# Patient Record
Sex: Male | Born: 1945 | Race: White | Marital: Married | Smoking: Former smoker
Health system: Southern US, Community
[De-identification: ages and names within clinical notes are randomized; demographics above are authoritative.]

## PROBLEM LIST (undated history)

## (undated) DIAGNOSIS — I509 Heart failure, unspecified: Secondary | ICD-10-CM

## (undated) DIAGNOSIS — T18128A Food in esophagus causing other injury, initial encounter: Secondary | ICD-10-CM

## (undated) DIAGNOSIS — I4891 Unspecified atrial fibrillation: Secondary | ICD-10-CM

## (undated) DIAGNOSIS — N189 Chronic kidney disease, unspecified: Secondary | ICD-10-CM

## (undated) DIAGNOSIS — M069 Rheumatoid arthritis, unspecified: Secondary | ICD-10-CM

## (undated) DIAGNOSIS — K220 Achalasia of cardia: Secondary | ICD-10-CM

## (undated) HISTORY — PX: ESOPHAGOGASTRODUODENOSCOPY: SHX1529

---

## 1976-05-14 HISTORY — PX: ANKLE FRACTURE SURGERY: SHX122

## 2010-08-07 ENCOUNTER — Other Ambulatory Visit: Payer: Self-pay | Admitting: *Deleted

## 2010-08-07 DIAGNOSIS — R935 Abnormal findings on diagnostic imaging of other abdominal regions, including retroperitoneum: Secondary | ICD-10-CM

## 2010-08-14 ENCOUNTER — Ambulatory Visit
Admission: RE | Admit: 2010-08-14 | Discharge: 2010-08-14 | Disposition: A | Discharge status: Home / Self Care | Payer: Non-veteran care | Source: Ambulatory Visit | Attending: *Deleted | Admitting: *Deleted

## 2010-08-14 DIAGNOSIS — R935 Abnormal findings on diagnostic imaging of other abdominal regions, including retroperitoneum: Secondary | ICD-10-CM

## 2010-08-14 MED ORDER — GADOBENATE DIMEGLUMINE 529 MG/ML IV SOLN
10.0000 mL | Freq: Once | INTRAVENOUS | Status: AC | PRN
  Administered 2010-08-14: 10 mL via INTRAVENOUS

## 2013-11-25 ENCOUNTER — Encounter (HOSPITAL_BASED_OUTPATIENT_CLINIC_OR_DEPARTMENT_OTHER): Payer: Medicare HMO | Attending: General Surgery

## 2013-11-25 DIAGNOSIS — Z79899 Other long term (current) drug therapy: Secondary | ICD-10-CM | POA: Insufficient documentation

## 2013-11-25 DIAGNOSIS — M069 Rheumatoid arthritis, unspecified: Secondary | ICD-10-CM | POA: Insufficient documentation

## 2013-11-25 DIAGNOSIS — L98499 Non-pressure chronic ulcer of skin of other sites with unspecified severity: Secondary | ICD-10-CM | POA: Diagnosis present

## 2013-11-25 DIAGNOSIS — Z7982 Long term (current) use of aspirin: Secondary | ICD-10-CM | POA: Insufficient documentation

## 2013-11-26 NOTE — Progress Notes (Signed)
[facility]  [name], [name] NO.:  [id number]  MEDICAL RECORD NO.:  [id number]      DATE OF BIRTH:  1945/10/02  PHYSICIAN:  Ardath Sax, M.D.           VISIT DATE:                                  OFFICE VISIT   Mr. Earl Barber is a 68 year old, morbidly obese man, who weighs well over 300 pounds, who comes to [place] because of a several months standing of an ulcer about 4-5 mm in size on his right buttocks.  This man has rheumatoid arthritis and gets around very poorly and spends a lot of time in a wheelchair, and I feel like he had like seborrheic dermatitis or some sort of a infection in pore or like a sebaceous cyst, because he has a small ulcer and it does not go very deep at all.  It just appears to involve the epidermis and dermis.  This man has severe rheumatoid arthritis, and he is on many medicines including allopurinol, alprazolam, aspirin, hydrocodone, cyanocobalamin, folic acid, Lasix, lisinopril, methotrexate, vitamins, ranitidine, and venlafaxine.  I am going to treat this with collagen and cover with DuoDERM and see how it does.  So, his diagnosis is small ulcer right buttocks probably has do with pressure or inflammation of his oil glands on the buttocks other diagnoses are hypertension, morbid obesity, and rheumatoid arthritis.     Ardath Sax, M.D.     PP/MEDQ  D:  11/25/2013  T:  11/26/2013  Job:  [id number]

## 2013-12-02 DIAGNOSIS — M069 Rheumatoid arthritis, unspecified: Secondary | ICD-10-CM | POA: Diagnosis not present

## 2013-12-02 DIAGNOSIS — L98499 Non-pressure chronic ulcer of skin of other sites with unspecified severity: Secondary | ICD-10-CM | POA: Diagnosis not present

## 2013-12-02 DIAGNOSIS — Z79899 Other long term (current) drug therapy: Secondary | ICD-10-CM | POA: Diagnosis not present

## 2015-02-15 DIAGNOSIS — M1711 Unilateral primary osteoarthritis, right knee: Secondary | ICD-10-CM | POA: Diagnosis not present

## 2015-02-15 DIAGNOSIS — R2681 Unsteadiness on feet: Secondary | ICD-10-CM | POA: Diagnosis not present

## 2015-02-15 DIAGNOSIS — M170 Bilateral primary osteoarthritis of knee: Secondary | ICD-10-CM | POA: Diagnosis not present

## 2015-02-15 DIAGNOSIS — M25562 Pain in left knee: Secondary | ICD-10-CM | POA: Diagnosis not present

## 2015-02-15 DIAGNOSIS — M25561 Pain in right knee: Secondary | ICD-10-CM | POA: Diagnosis not present

## 2015-02-18 DIAGNOSIS — M170 Bilateral primary osteoarthritis of knee: Secondary | ICD-10-CM | POA: Diagnosis not present

## 2015-02-18 DIAGNOSIS — M1712 Unilateral primary osteoarthritis, left knee: Secondary | ICD-10-CM | POA: Diagnosis not present

## 2015-02-18 DIAGNOSIS — R2681 Unsteadiness on feet: Secondary | ICD-10-CM | POA: Diagnosis not present

## 2015-02-18 DIAGNOSIS — M25561 Pain in right knee: Secondary | ICD-10-CM | POA: Diagnosis not present

## 2015-02-18 DIAGNOSIS — M25562 Pain in left knee: Secondary | ICD-10-CM | POA: Diagnosis not present

## 2015-02-28 DIAGNOSIS — M25561 Pain in right knee: Secondary | ICD-10-CM | POA: Diagnosis not present

## 2015-02-28 DIAGNOSIS — M1711 Unilateral primary osteoarthritis, right knee: Secondary | ICD-10-CM | POA: Diagnosis not present

## 2015-03-04 DIAGNOSIS — M1712 Unilateral primary osteoarthritis, left knee: Secondary | ICD-10-CM | POA: Diagnosis not present

## 2015-03-04 DIAGNOSIS — M25562 Pain in left knee: Secondary | ICD-10-CM | POA: Diagnosis not present

## 2015-06-15 DIAGNOSIS — M170 Bilateral primary osteoarthritis of knee: Secondary | ICD-10-CM | POA: Diagnosis not present

## 2015-06-15 DIAGNOSIS — M25562 Pain in left knee: Secondary | ICD-10-CM | POA: Diagnosis not present

## 2015-06-15 DIAGNOSIS — M25561 Pain in right knee: Secondary | ICD-10-CM | POA: Diagnosis not present

## 2015-06-15 DIAGNOSIS — R262 Difficulty in walking, not elsewhere classified: Secondary | ICD-10-CM | POA: Diagnosis not present

## 2016-02-01 DIAGNOSIS — R69 Illness, unspecified: Secondary | ICD-10-CM | POA: Diagnosis not present

## 2016-08-20 DIAGNOSIS — R1310 Dysphagia, unspecified: Secondary | ICD-10-CM | POA: Diagnosis not present

## 2016-08-20 DIAGNOSIS — Z0000 Encounter for general adult medical examination without abnormal findings: Secondary | ICD-10-CM | POA: Diagnosis not present

## 2016-08-20 DIAGNOSIS — Z79899 Other long term (current) drug therapy: Secondary | ICD-10-CM | POA: Diagnosis not present

## 2016-08-20 DIAGNOSIS — Z8546 Personal history of malignant neoplasm of prostate: Secondary | ICD-10-CM | POA: Diagnosis not present

## 2016-08-20 DIAGNOSIS — K219 Gastro-esophageal reflux disease without esophagitis: Secondary | ICD-10-CM | POA: Diagnosis not present

## 2016-08-20 DIAGNOSIS — R600 Localized edema: Secondary | ICD-10-CM | POA: Diagnosis not present

## 2016-08-20 DIAGNOSIS — I10 Essential (primary) hypertension: Secondary | ICD-10-CM | POA: Diagnosis not present

## 2016-08-20 DIAGNOSIS — M109 Gout, unspecified: Secondary | ICD-10-CM | POA: Diagnosis not present

## 2016-08-20 DIAGNOSIS — R69 Illness, unspecified: Secondary | ICD-10-CM | POA: Diagnosis not present

## 2016-08-20 DIAGNOSIS — K5900 Constipation, unspecified: Secondary | ICD-10-CM | POA: Diagnosis not present

## 2016-08-20 DIAGNOSIS — K08109 Complete loss of teeth, unspecified cause, unspecified class: Secondary | ICD-10-CM | POA: Diagnosis not present

## 2016-08-20 DIAGNOSIS — G4730 Sleep apnea, unspecified: Secondary | ICD-10-CM | POA: Diagnosis not present

## 2016-08-20 DIAGNOSIS — R110 Nausea: Secondary | ICD-10-CM | POA: Diagnosis not present

## 2016-08-20 DIAGNOSIS — R0600 Dyspnea, unspecified: Secondary | ICD-10-CM | POA: Diagnosis not present

## 2016-08-20 DIAGNOSIS — Z9989 Dependence on other enabling machines and devices: Secondary | ICD-10-CM | POA: Diagnosis not present

## 2017-01-03 HISTORY — PX: ESOPHAGOSCOPY W/ BOTOX INJECTION: SHX1533

## 2017-02-17 DIAGNOSIS — R69 Illness, unspecified: Secondary | ICD-10-CM | POA: Diagnosis not present

## 2017-03-20 DIAGNOSIS — Z4682 Encounter for fitting and adjustment of non-vascular catheter: Secondary | ICD-10-CM | POA: Diagnosis not present

## 2017-03-20 DIAGNOSIS — M069 Rheumatoid arthritis, unspecified: Secondary | ICD-10-CM | POA: Diagnosis not present

## 2017-03-20 DIAGNOSIS — R402441 Other coma, without documented Glasgow coma scale score, or with partial score reported, in the field [EMT or ambulance]: Secondary | ICD-10-CM | POA: Diagnosis not present

## 2017-03-20 DIAGNOSIS — Z7952 Long term (current) use of systemic steroids: Secondary | ICD-10-CM | POA: Diagnosis not present

## 2017-03-20 DIAGNOSIS — I130 Hypertensive heart and chronic kidney disease with heart failure and stage 1 through stage 4 chronic kidney disease, or unspecified chronic kidney disease: Secondary | ICD-10-CM | POA: Diagnosis present

## 2017-03-20 DIAGNOSIS — R6521 Severe sepsis with septic shock: Secondary | ICD-10-CM | POA: Diagnosis not present

## 2017-03-20 DIAGNOSIS — I5021 Acute systolic (congestive) heart failure: Secondary | ICD-10-CM | POA: Diagnosis not present

## 2017-03-20 DIAGNOSIS — Z888 Allergy status to other drugs, medicaments and biological substances status: Secondary | ICD-10-CM | POA: Diagnosis not present

## 2017-03-20 DIAGNOSIS — Y95 Nosocomial condition: Secondary | ICD-10-CM | POA: Diagnosis not present

## 2017-03-20 DIAGNOSIS — R531 Weakness: Secondary | ICD-10-CM | POA: Diagnosis not present

## 2017-03-20 DIAGNOSIS — R262 Difficulty in walking, not elsewhere classified: Secondary | ICD-10-CM | POA: Diagnosis not present

## 2017-03-20 DIAGNOSIS — I6340 Cerebral infarction due to embolism of unspecified cerebral artery: Secondary | ICD-10-CM | POA: Diagnosis not present

## 2017-03-20 DIAGNOSIS — J9601 Acute respiratory failure with hypoxia: Secondary | ICD-10-CM | POA: Diagnosis not present

## 2017-03-20 DIAGNOSIS — Z7189 Other specified counseling: Secondary | ICD-10-CM | POA: Diagnosis not present

## 2017-03-20 DIAGNOSIS — Z515 Encounter for palliative care: Secondary | ICD-10-CM | POA: Diagnosis not present

## 2017-03-20 DIAGNOSIS — Z882 Allergy status to sulfonamides status: Secondary | ICD-10-CM | POA: Diagnosis not present

## 2017-03-20 DIAGNOSIS — F22 Delusional disorders: Secondary | ICD-10-CM | POA: Diagnosis present

## 2017-03-20 DIAGNOSIS — N170 Acute kidney failure with tubular necrosis: Secondary | ICD-10-CM | POA: Diagnosis not present

## 2017-03-20 DIAGNOSIS — Z452 Encounter for adjustment and management of vascular access device: Secondary | ICD-10-CM | POA: Diagnosis not present

## 2017-03-20 DIAGNOSIS — Z66 Do not resuscitate: Secondary | ICD-10-CM | POA: Diagnosis not present

## 2017-03-20 DIAGNOSIS — R4182 Altered mental status, unspecified: Secondary | ICD-10-CM | POA: Diagnosis not present

## 2017-03-20 DIAGNOSIS — T85898A Other specified complication of other internal prosthetic devices, implants and grafts, initial encounter: Secondary | ICD-10-CM | POA: Diagnosis not present

## 2017-03-20 DIAGNOSIS — M6281 Muscle weakness (generalized): Secondary | ICD-10-CM | POA: Diagnosis not present

## 2017-03-20 DIAGNOSIS — M25061 Hemarthrosis, right knee: Secondary | ICD-10-CM | POA: Diagnosis not present

## 2017-03-20 DIAGNOSIS — J9690 Respiratory failure, unspecified, unspecified whether with hypoxia or hypercapnia: Secondary | ICD-10-CM | POA: Diagnosis not present

## 2017-03-20 DIAGNOSIS — N183 Chronic kidney disease, stage 3 (moderate): Secondary | ICD-10-CM | POA: Diagnosis not present

## 2017-03-20 DIAGNOSIS — K220 Achalasia of cardia: Secondary | ICD-10-CM | POA: Diagnosis present

## 2017-03-20 DIAGNOSIS — I517 Cardiomegaly: Secondary | ICD-10-CM | POA: Diagnosis not present

## 2017-03-20 DIAGNOSIS — R1319 Other dysphagia: Secondary | ICD-10-CM | POA: Diagnosis not present

## 2017-03-20 DIAGNOSIS — R410 Disorientation, unspecified: Secondary | ICD-10-CM | POA: Diagnosis present

## 2017-03-20 DIAGNOSIS — A419 Sepsis, unspecified organism: Secondary | ICD-10-CM | POA: Diagnosis not present

## 2017-03-20 DIAGNOSIS — K8080 Other cholelithiasis without obstruction: Secondary | ICD-10-CM | POA: Diagnosis not present

## 2017-03-20 DIAGNOSIS — D631 Anemia in chronic kidney disease: Secondary | ICD-10-CM | POA: Diagnosis present

## 2017-03-20 DIAGNOSIS — K210 Gastro-esophageal reflux disease with esophagitis: Secondary | ICD-10-CM | POA: Diagnosis not present

## 2017-03-20 DIAGNOSIS — R918 Other nonspecific abnormal finding of lung field: Secondary | ICD-10-CM | POA: Diagnosis not present

## 2017-03-20 DIAGNOSIS — Z9109 Other allergy status, other than to drugs and biological substances: Secondary | ICD-10-CM | POA: Diagnosis not present

## 2017-03-20 DIAGNOSIS — D689 Coagulation defect, unspecified: Secondary | ICD-10-CM | POA: Diagnosis present

## 2017-03-20 DIAGNOSIS — E2740 Unspecified adrenocortical insufficiency: Secondary | ICD-10-CM | POA: Diagnosis present

## 2017-03-20 DIAGNOSIS — I639 Cerebral infarction, unspecified: Secondary | ICD-10-CM | POA: Diagnosis not present

## 2017-03-20 DIAGNOSIS — G894 Chronic pain syndrome: Secondary | ICD-10-CM | POA: Diagnosis not present

## 2017-03-20 DIAGNOSIS — E870 Hyperosmolality and hypernatremia: Secondary | ICD-10-CM | POA: Diagnosis present

## 2017-03-20 DIAGNOSIS — R2681 Unsteadiness on feet: Secondary | ICD-10-CM | POA: Diagnosis not present

## 2017-03-20 DIAGNOSIS — I4891 Unspecified atrial fibrillation: Secondary | ICD-10-CM | POA: Diagnosis not present

## 2017-03-20 DIAGNOSIS — K5780 Diverticulitis of intestine, part unspecified, with perforation and abscess without bleeding: Secondary | ICD-10-CM | POA: Diagnosis not present

## 2017-03-20 DIAGNOSIS — J189 Pneumonia, unspecified organism: Secondary | ICD-10-CM | POA: Diagnosis not present

## 2017-03-20 DIAGNOSIS — I214 Non-ST elevation (NSTEMI) myocardial infarction: Secondary | ICD-10-CM | POA: Diagnosis not present

## 2017-03-20 DIAGNOSIS — L89109 Pressure ulcer of unspecified part of back, unspecified stage: Secondary | ICD-10-CM | POA: Diagnosis not present

## 2017-03-20 DIAGNOSIS — D6489 Other specified anemias: Secondary | ICD-10-CM | POA: Diagnosis present

## 2017-03-20 DIAGNOSIS — G9341 Metabolic encephalopathy: Secondary | ICD-10-CM | POA: Diagnosis not present

## 2017-03-20 DIAGNOSIS — G4733 Obstructive sleep apnea (adult) (pediatric): Secondary | ICD-10-CM | POA: Diagnosis not present

## 2017-03-22 DIAGNOSIS — K210 Gastro-esophageal reflux disease with esophagitis: Secondary | ICD-10-CM | POA: Diagnosis not present

## 2017-03-22 DIAGNOSIS — G4733 Obstructive sleep apnea (adult) (pediatric): Secondary | ICD-10-CM | POA: Diagnosis not present

## 2017-03-22 DIAGNOSIS — M25061 Hemarthrosis, right knee: Secondary | ICD-10-CM | POA: Diagnosis not present

## 2017-03-22 DIAGNOSIS — G894 Chronic pain syndrome: Secondary | ICD-10-CM | POA: Diagnosis not present

## 2017-03-22 DIAGNOSIS — M069 Rheumatoid arthritis, unspecified: Secondary | ICD-10-CM | POA: Diagnosis not present

## 2017-03-24 ENCOUNTER — Other Ambulatory Visit: Payer: Self-pay

## 2017-03-24 ENCOUNTER — Inpatient Hospital Stay (HOSPITAL_COMMUNITY)
Admission: EM | Admit: 2017-03-24 | Discharge: 2017-04-13 | DRG: 871 | Disposition: E | Payer: Medicare HMO | Attending: Pulmonary Disease | Admitting: Pulmonary Disease

## 2017-03-24 ENCOUNTER — Encounter (HOSPITAL_COMMUNITY): Payer: Self-pay

## 2017-03-24 ENCOUNTER — Emergency Department (HOSPITAL_COMMUNITY): Payer: Medicare HMO

## 2017-03-24 DIAGNOSIS — Z515 Encounter for palliative care: Secondary | ICD-10-CM

## 2017-03-24 DIAGNOSIS — F22 Delusional disorders: Secondary | ICD-10-CM | POA: Diagnosis present

## 2017-03-24 DIAGNOSIS — I6340 Cerebral infarction due to embolism of unspecified cerebral artery: Secondary | ICD-10-CM | POA: Diagnosis not present

## 2017-03-24 DIAGNOSIS — N183 Chronic kidney disease, stage 3 (moderate): Secondary | ICD-10-CM | POA: Diagnosis present

## 2017-03-24 DIAGNOSIS — M069 Rheumatoid arthritis, unspecified: Secondary | ICD-10-CM | POA: Diagnosis present

## 2017-03-24 DIAGNOSIS — Z7189 Other specified counseling: Secondary | ICD-10-CM | POA: Diagnosis not present

## 2017-03-24 DIAGNOSIS — R6521 Severe sepsis with septic shock: Secondary | ICD-10-CM | POA: Diagnosis not present

## 2017-03-24 DIAGNOSIS — Z781 Physical restraint status: Secondary | ICD-10-CM

## 2017-03-24 DIAGNOSIS — J189 Pneumonia, unspecified organism: Secondary | ICD-10-CM | POA: Diagnosis not present

## 2017-03-24 DIAGNOSIS — D689 Coagulation defect, unspecified: Secondary | ICD-10-CM | POA: Diagnosis present

## 2017-03-24 DIAGNOSIS — A419 Sepsis, unspecified organism: Principal | ICD-10-CM | POA: Diagnosis present

## 2017-03-24 DIAGNOSIS — R918 Other nonspecific abnormal finding of lung field: Secondary | ICD-10-CM | POA: Diagnosis not present

## 2017-03-24 DIAGNOSIS — Y95 Nosocomial condition: Secondary | ICD-10-CM | POA: Diagnosis not present

## 2017-03-24 DIAGNOSIS — I517 Cardiomegaly: Secondary | ICD-10-CM | POA: Diagnosis not present

## 2017-03-24 DIAGNOSIS — Z452 Encounter for adjustment and management of vascular access device: Secondary | ICD-10-CM

## 2017-03-24 DIAGNOSIS — J9601 Acute respiratory failure with hypoxia: Secondary | ICD-10-CM | POA: Diagnosis not present

## 2017-03-24 DIAGNOSIS — R410 Disorientation, unspecified: Secondary | ICD-10-CM | POA: Diagnosis present

## 2017-03-24 DIAGNOSIS — E2740 Unspecified adrenocortical insufficiency: Secondary | ICD-10-CM | POA: Diagnosis present

## 2017-03-24 DIAGNOSIS — Z888 Allergy status to other drugs, medicaments and biological substances status: Secondary | ICD-10-CM | POA: Diagnosis not present

## 2017-03-24 DIAGNOSIS — J9690 Respiratory failure, unspecified, unspecified whether with hypoxia or hypercapnia: Secondary | ICD-10-CM

## 2017-03-24 DIAGNOSIS — Z87891 Personal history of nicotine dependence: Secondary | ICD-10-CM

## 2017-03-24 DIAGNOSIS — Z7901 Long term (current) use of anticoagulants: Secondary | ICD-10-CM

## 2017-03-24 DIAGNOSIS — R402441 Other coma, without documented Glasgow coma scale score, or with partial score reported, in the field [EMT or ambulance]: Secondary | ICD-10-CM | POA: Diagnosis not present

## 2017-03-24 DIAGNOSIS — Z7952 Long term (current) use of systemic steroids: Secondary | ICD-10-CM

## 2017-03-24 DIAGNOSIS — E870 Hyperosmolality and hypernatremia: Secondary | ICD-10-CM | POA: Diagnosis present

## 2017-03-24 DIAGNOSIS — E875 Hyperkalemia: Secondary | ICD-10-CM | POA: Diagnosis present

## 2017-03-24 DIAGNOSIS — K220 Achalasia of cardia: Secondary | ICD-10-CM | POA: Diagnosis present

## 2017-03-24 DIAGNOSIS — G9341 Metabolic encephalopathy: Secondary | ICD-10-CM | POA: Diagnosis present

## 2017-03-24 DIAGNOSIS — Z4682 Encounter for fitting and adjustment of non-vascular catheter: Secondary | ICD-10-CM | POA: Diagnosis not present

## 2017-03-24 DIAGNOSIS — D6489 Other specified anemias: Secondary | ICD-10-CM | POA: Diagnosis present

## 2017-03-24 DIAGNOSIS — Z882 Allergy status to sulfonamides status: Secondary | ICD-10-CM

## 2017-03-24 DIAGNOSIS — R739 Hyperglycemia, unspecified: Secondary | ICD-10-CM | POA: Diagnosis present

## 2017-03-24 DIAGNOSIS — L89152 Pressure ulcer of sacral region, stage 2: Secondary | ICD-10-CM | POA: Diagnosis present

## 2017-03-24 DIAGNOSIS — I130 Hypertensive heart and chronic kidney disease with heart failure and stage 1 through stage 4 chronic kidney disease, or unspecified chronic kidney disease: Secondary | ICD-10-CM | POA: Diagnosis not present

## 2017-03-24 DIAGNOSIS — L8990 Pressure ulcer of unspecified site, unspecified stage: Secondary | ICD-10-CM

## 2017-03-24 DIAGNOSIS — Z66 Do not resuscitate: Secondary | ICD-10-CM | POA: Diagnosis not present

## 2017-03-24 DIAGNOSIS — Z4659 Encounter for fitting and adjustment of other gastrointestinal appliance and device: Secondary | ICD-10-CM

## 2017-03-24 DIAGNOSIS — I214 Non-ST elevation (NSTEMI) myocardial infarction: Secondary | ICD-10-CM | POA: Diagnosis not present

## 2017-03-24 DIAGNOSIS — I639 Cerebral infarction, unspecified: Secondary | ICD-10-CM | POA: Diagnosis not present

## 2017-03-24 DIAGNOSIS — N170 Acute kidney failure with tubular necrosis: Secondary | ICD-10-CM | POA: Diagnosis not present

## 2017-03-24 DIAGNOSIS — I482 Chronic atrial fibrillation: Secondary | ICD-10-CM | POA: Diagnosis present

## 2017-03-24 DIAGNOSIS — I4891 Unspecified atrial fibrillation: Secondary | ICD-10-CM | POA: Diagnosis not present

## 2017-03-24 DIAGNOSIS — Z79899 Other long term (current) drug therapy: Secondary | ICD-10-CM

## 2017-03-24 DIAGNOSIS — Z9109 Other allergy status, other than to drugs and biological substances: Secondary | ICD-10-CM

## 2017-03-24 DIAGNOSIS — T85898A Other specified complication of other internal prosthetic devices, implants and grafts, initial encounter: Secondary | ICD-10-CM | POA: Diagnosis not present

## 2017-03-24 DIAGNOSIS — Z7982 Long term (current) use of aspirin: Secondary | ICD-10-CM

## 2017-03-24 DIAGNOSIS — I5021 Acute systolic (congestive) heart failure: Secondary | ICD-10-CM | POA: Diagnosis not present

## 2017-03-24 DIAGNOSIS — E46 Unspecified protein-calorie malnutrition: Secondary | ICD-10-CM

## 2017-03-24 DIAGNOSIS — D631 Anemia in chronic kidney disease: Secondary | ICD-10-CM | POA: Diagnosis present

## 2017-03-24 DIAGNOSIS — R29711 NIHSS score 11: Secondary | ICD-10-CM | POA: Diagnosis present

## 2017-03-24 DIAGNOSIS — R4182 Altered mental status, unspecified: Secondary | ICD-10-CM | POA: Diagnosis not present

## 2017-03-24 HISTORY — DX: Unspecified atrial fibrillation: I48.91

## 2017-03-24 HISTORY — DX: Achalasia of cardia: K22.0

## 2017-03-24 HISTORY — DX: Rheumatoid arthritis, unspecified: M06.9

## 2017-03-24 HISTORY — DX: Chronic kidney disease, unspecified: N18.9

## 2017-03-24 HISTORY — DX: Heart failure, unspecified: I50.9

## 2017-03-24 HISTORY — DX: Food in esophagus causing other injury, initial encounter: T18.128A

## 2017-03-24 LAB — URINALYSIS, ROUTINE W REFLEX MICROSCOPIC
BILIRUBIN URINE: NEGATIVE
Glucose, UA: NEGATIVE mg/dL
Hgb urine dipstick: NEGATIVE
KETONES UR: NEGATIVE mg/dL
Leukocytes, UA: NEGATIVE
NITRITE: NEGATIVE
PROTEIN: NEGATIVE mg/dL
SPECIFIC GRAVITY, URINE: 1.014 (ref 1.005–1.030)
pH: 5 (ref 5.0–8.0)

## 2017-03-24 LAB — I-STAT ARTERIAL BLOOD GAS, ED
Acid-base deficit: 1 mmol/L (ref 0.0–2.0)
Bicarbonate: 26.1 mmol/L (ref 20.0–28.0)
O2 SAT: 99 %
PCO2 ART: 53.8 mmHg — AB (ref 32.0–48.0)
TCO2: 28 mmol/L (ref 22–32)
pH, Arterial: 7.296 — ABNORMAL LOW (ref 7.350–7.450)
pO2, Arterial: 146 mmHg — ABNORMAL HIGH (ref 83.0–108.0)

## 2017-03-24 LAB — CBC WITH DIFFERENTIAL/PLATELET
BASOS PCT: 0 %
Basophils Absolute: 0 10*3/uL (ref 0.0–0.1)
EOS ABS: 0.2 10*3/uL (ref 0.0–0.7)
Eosinophils Relative: 1 %
HCT: 32 % — ABNORMAL LOW (ref 39.0–52.0)
HEMOGLOBIN: 10.3 g/dL — AB (ref 13.0–17.0)
LYMPHS ABS: 0.8 10*3/uL (ref 0.7–4.0)
LYMPHS PCT: 5 %
MCH: 33.7 pg (ref 26.0–34.0)
MCHC: 32.2 g/dL (ref 30.0–36.0)
MCV: 104.6 fL — ABNORMAL HIGH (ref 78.0–100.0)
MONOS PCT: 10 %
Monocytes Absolute: 1.7 10*3/uL — ABNORMAL HIGH (ref 0.1–1.0)
NEUTROS ABS: 13.8 10*3/uL — AB (ref 1.7–7.7)
NEUTROS PCT: 84 %
PLATELETS: 362 10*3/uL (ref 150–400)
RBC: 3.06 MIL/uL — ABNORMAL LOW (ref 4.22–5.81)
RDW: 14.9 % (ref 11.5–15.5)
WBC: 16.5 10*3/uL — ABNORMAL HIGH (ref 4.0–10.5)

## 2017-03-24 LAB — I-STAT VENOUS BLOOD GAS, ED
Acid-base deficit: 2 mmol/L (ref 0.0–2.0)
BICARBONATE: 25.4 mmol/L (ref 20.0–28.0)
O2 Saturation: 62 %
TCO2: 27 mmol/L (ref 22–32)
pCO2, Ven: 52.1 mmHg (ref 44.0–60.0)
pH, Ven: 7.298 (ref 7.250–7.430)
pO2, Ven: 37 mmHg (ref 32.0–45.0)

## 2017-03-24 LAB — I-STAT TROPONIN, ED: TROPONIN I, POC: 0.05 ng/mL (ref 0.00–0.08)

## 2017-03-24 LAB — COMPREHENSIVE METABOLIC PANEL
ALBUMIN: 2.8 g/dL — AB (ref 3.5–5.0)
ALT: 29 U/L (ref 17–63)
ANION GAP: 11 (ref 5–15)
AST: 29 U/L (ref 15–41)
Alkaline Phosphatase: 138 U/L — ABNORMAL HIGH (ref 38–126)
BUN: 76 mg/dL — ABNORMAL HIGH (ref 6–20)
CALCIUM: 9.2 mg/dL (ref 8.9–10.3)
CHLORIDE: 97 mmol/L — AB (ref 101–111)
CO2: 24 mmol/L (ref 22–32)
Creatinine, Ser: 4.24 mg/dL — ABNORMAL HIGH (ref 0.61–1.24)
GFR calc non Af Amer: 13 mL/min — ABNORMAL LOW (ref >60)
GFR, EST AFRICAN AMERICAN: 15 mL/min — AB (ref >60)
GLUCOSE: 128 mg/dL — AB (ref 65–99)
POTASSIUM: 5.5 mmol/L — AB (ref 3.5–5.1)
SODIUM: 132 mmol/L — AB (ref 135–145)
Total Bilirubin: 1.2 mg/dL (ref 0.3–1.2)
Total Protein: 6.5 g/dL (ref 6.5–8.1)

## 2017-03-24 LAB — BASIC METABOLIC PANEL
Anion gap: 12 (ref 5–15)
BUN: 71 mg/dL — AB (ref 6–20)
CALCIUM: 8.7 mg/dL — AB (ref 8.9–10.3)
CO2: 22 mmol/L (ref 22–32)
CREATININE: 3.89 mg/dL — AB (ref 0.61–1.24)
Chloride: 100 mmol/L — ABNORMAL LOW (ref 101–111)
GFR calc non Af Amer: 14 mL/min — ABNORMAL LOW (ref >60)
GFR, EST AFRICAN AMERICAN: 16 mL/min — AB (ref >60)
GLUCOSE: 185 mg/dL — AB (ref 65–99)
Potassium: 5 mmol/L (ref 3.5–5.1)
Sodium: 134 mmol/L — ABNORMAL LOW (ref 135–145)

## 2017-03-24 LAB — GLUCOSE, CAPILLARY: Glucose-Capillary: 171 mg/dL — ABNORMAL HIGH (ref 65–99)

## 2017-03-24 LAB — PROCALCITONIN: Procalcitonin: 0.66 ng/mL

## 2017-03-24 LAB — TYPE AND SCREEN
ABO/RH(D): O POS
ANTIBODY SCREEN: NEGATIVE

## 2017-03-24 LAB — CBG MONITORING, ED: GLUCOSE-CAPILLARY: 110 mg/dL — AB (ref 65–99)

## 2017-03-24 LAB — STREP PNEUMONIAE URINARY ANTIGEN: STREP PNEUMO URINARY ANTIGEN: NEGATIVE

## 2017-03-24 LAB — PROTIME-INR
INR: 2.66
PROTHROMBIN TIME: 28.1 s — AB (ref 11.4–15.2)

## 2017-03-24 LAB — I-STAT CG4 LACTIC ACID, ED
LACTIC ACID, VENOUS: 1.01 mmol/L (ref 0.5–1.9)
Lactic Acid, Venous: 0.76 mmol/L (ref 0.5–1.9)

## 2017-03-24 LAB — BRAIN NATRIURETIC PEPTIDE: B NATRIURETIC PEPTIDE 5: 78.1 pg/mL (ref 0.0–100.0)

## 2017-03-24 LAB — MAGNESIUM: Magnesium: 2.2 mg/dL (ref 1.7–2.4)

## 2017-03-24 LAB — TROPONIN I: Troponin I: <0.03 ng/mL (ref <0.03)

## 2017-03-24 LAB — PHOSPHORUS: Phosphorus: 5 mg/dL — ABNORMAL HIGH (ref 2.5–4.6)

## 2017-03-24 LAB — LACTIC ACID, PLASMA: Lactic Acid, Venous: 1.3 mmol/L (ref 0.5–1.9)

## 2017-03-24 LAB — APTT: aPTT: 51 seconds — ABNORMAL HIGH (ref 24–36)

## 2017-03-24 MED ORDER — SODIUM CHLORIDE 0.9 % IV BOLUS (SEPSIS)
500.0000 mL | Freq: Once | INTRAVENOUS | Status: AC
  Administered 2017-03-24: 500 mL via INTRAVENOUS

## 2017-03-24 MED ORDER — PANTOPRAZOLE SODIUM 40 MG IV SOLR
40.0000 mg | Freq: Two times a day (BID) | INTRAVENOUS | Status: DC
  Administered 2017-03-24 – 2017-03-28 (×8): 40 mg via INTRAVENOUS
  Filled 2017-03-24 (×8): qty 40

## 2017-03-24 MED ORDER — ORAL CARE MOUTH RINSE
15.0000 mL | Freq: Four times a day (QID) | OROMUCOSAL | Status: DC
  Administered 2017-03-24 – 2017-03-28 (×14): 15 mL via OROMUCOSAL

## 2017-03-24 MED ORDER — SODIUM CHLORIDE 0.9 % IV BOLUS (SEPSIS)
2000.0000 mL | Freq: Once | INTRAVENOUS | Status: AC
  Administered 2017-03-24: 2000 mL via INTRAVENOUS

## 2017-03-24 MED ORDER — PIPERACILLIN-TAZOBACTAM IN DEX 2-0.25 GM/50ML IV SOLN
2.2500 g | Freq: Four times a day (QID) | INTRAVENOUS | Status: DC
  Administered 2017-03-24 – 2017-03-25 (×3): 2.25 g via INTRAVENOUS
  Filled 2017-03-24 (×4): qty 50

## 2017-03-24 MED ORDER — SODIUM CHLORIDE 0.9 % IV BOLUS (SEPSIS): 500.0000 mL | Freq: Once | INTRAVENOUS | Status: DC

## 2017-03-24 MED ORDER — HEPARIN SODIUM (PORCINE) 5000 UNIT/ML IJ SOLN
5000.0000 [IU] | Freq: Three times a day (TID) | INTRAMUSCULAR | Status: DC
  Administered 2017-03-25 – 2017-03-28 (×10): 5000 [IU] via SUBCUTANEOUS
  Filled 2017-03-24 (×11): qty 1

## 2017-03-24 MED ORDER — VANCOMYCIN HCL IN DEXTROSE 1-5 GM/200ML-% IV SOLN
1000.0000 mg | Freq: Once | INTRAVENOUS | Status: AC
  Administered 2017-03-24: 1000 mg via INTRAVENOUS
  Filled 2017-03-24: qty 200

## 2017-03-24 MED ORDER — SODIUM CHLORIDE 0.9 % IV SOLN: 250.0000 mL | INTRAVENOUS | Status: DC | PRN

## 2017-03-24 MED ORDER — FAMOTIDINE IN NACL 20-0.9 MG/50ML-% IV SOLN
20.0000 mg | Freq: Two times a day (BID) | INTRAVENOUS | Status: DC
  Administered 2017-03-24 – 2017-03-25 (×2): 20 mg via INTRAVENOUS
  Filled 2017-03-24 (×2): qty 50

## 2017-03-24 MED ORDER — DEXTROSE 5 % IV SOLN
500.0000 mg | INTRAVENOUS | Status: DC
  Administered 2017-03-24: 500 mg via INTRAVENOUS
  Filled 2017-03-24: qty 500

## 2017-03-24 MED ORDER — NOREPINEPHRINE BITARTRATE 1 MG/ML IV SOLN
0.0000 ug/min | Freq: Once | INTRAVENOUS | Status: DC
  Filled 2017-03-24: qty 4

## 2017-03-24 MED ORDER — FENTANYL CITRATE (PF) 100 MCG/2ML IJ SOLN
25.0000 ug | INTRAMUSCULAR | Status: DC | PRN
  Administered 2017-03-24 – 2017-03-27 (×9): 50 ug via INTRAVENOUS
  Filled 2017-03-24 (×10): qty 2

## 2017-03-24 MED ORDER — PHENYLEPHRINE HCL 10 MG/ML IJ SOLN
INTRAMUSCULAR | Status: AC | PRN
  Administered 2017-03-24: 50 ug

## 2017-03-24 MED ORDER — ETOMIDATE 2 MG/ML IV SOLN
INTRAVENOUS | Status: AC | PRN
  Administered 2017-03-24: 20 mg via INTRAVENOUS

## 2017-03-24 MED ORDER — ROCURONIUM BROMIDE 50 MG/5ML IV SOLN
INTRAVENOUS | Status: AC | PRN
  Administered 2017-03-24: 80 mg via INTRAVENOUS

## 2017-03-24 MED ORDER — VANCOMYCIN HCL IN DEXTROSE 1-5 GM/200ML-% IV SOLN
1000.0000 mg | INTRAVENOUS | Status: DC
  Filled 2017-03-24: qty 200

## 2017-03-24 MED ORDER — CHLORHEXIDINE GLUCONATE 0.12% ORAL RINSE (MEDLINE KIT)
15.0000 mL | Freq: Two times a day (BID) | OROMUCOSAL | Status: DC
Start: 2017-03-24 — End: 2017-03-28
  Administered 2017-03-24 – 2017-03-28 (×8): 15 mL via OROMUCOSAL

## 2017-03-24 MED ORDER — HYDROCORTISONE NA SUCCINATE PF 100 MG IJ SOLR
50.0000 mg | Freq: Four times a day (QID) | INTRAMUSCULAR | Status: DC
  Administered 2017-03-24 – 2017-03-29 (×19): 50 mg via INTRAVENOUS
  Filled 2017-03-24 (×5): qty 1
  Filled 2017-03-24: qty 2
  Filled 2017-03-24 (×3): qty 1
  Filled 2017-03-24: qty 2
  Filled 2017-03-24 (×4): qty 1
  Filled 2017-03-24: qty 2
  Filled 2017-03-24 (×5): qty 1

## 2017-03-24 MED ORDER — HYDROCORTISONE NA SUCCINATE PF 100 MG IJ SOLR
100.0000 mg | Freq: Once | INTRAMUSCULAR | Status: AC
  Administered 2017-03-24: 100 mg via INTRAVENOUS
  Filled 2017-03-24: qty 2

## 2017-03-24 MED ORDER — PIPERACILLIN-TAZOBACTAM 3.375 G IVPB 30 MIN
3.3750 g | Freq: Once | INTRAVENOUS | Status: AC
  Administered 2017-03-24: 3.375 g via INTRAVENOUS
  Filled 2017-03-24: qty 50

## 2017-03-24 MED ORDER — PHENYLEPHRINE 40 MCG/ML (10ML) SYRINGE FOR IV PUSH (FOR BLOOD PRESSURE SUPPORT)
PREFILLED_SYRINGE | INTRAVENOUS | Status: AC
  Filled 2017-03-24: qty 10

## 2017-03-24 MED ORDER — INSULIN ASPART 100 UNIT/ML ~~LOC~~ SOLN
0.0000 [IU] | SUBCUTANEOUS | Status: DC
  Administered 2017-03-25: 1 [IU] via SUBCUTANEOUS
  Administered 2017-03-25: 2 [IU] via SUBCUTANEOUS
  Administered 2017-03-25 (×2): 1 [IU] via SUBCUTANEOUS
  Administered 2017-03-25: 2 [IU] via SUBCUTANEOUS
  Administered 2017-03-26 (×2): 1 [IU] via SUBCUTANEOUS
  Administered 2017-03-27: 2 [IU] via SUBCUTANEOUS
  Administered 2017-03-27 – 2017-03-28 (×3): 1 [IU] via SUBCUTANEOUS
  Administered 2017-03-28 (×2): 2 [IU] via SUBCUTANEOUS

## 2017-03-24 MED ORDER — SODIUM CHLORIDE 0.9 % IV BOLUS (SEPSIS)
1000.0000 mL | Freq: Once | INTRAVENOUS | Status: AC
  Administered 2017-03-24: 1000 mL via INTRAVENOUS

## 2017-03-24 MED ORDER — MIDAZOLAM HCL 2 MG/2ML IJ SOLN
1.0000 mg | INTRAMUSCULAR | Status: DC | PRN
  Administered 2017-03-24 – 2017-03-25 (×4): 2 mg via INTRAVENOUS
  Administered 2017-03-25: 1 mg via INTRAVENOUS
  Administered 2017-03-25: 4 mg via INTRAVENOUS
  Administered 2017-03-26: 2 mg via INTRAVENOUS
  Administered 2017-03-26: 4 mg via INTRAVENOUS
  Administered 2017-03-26 – 2017-03-27 (×8): 2 mg via INTRAVENOUS
  Filled 2017-03-24 (×4): qty 2
  Filled 2017-03-24 (×2): qty 4
  Filled 2017-03-24 (×7): qty 2
  Filled 2017-03-24: qty 4
  Filled 2017-03-24: qty 2

## 2017-03-24 MED ORDER — FUROSEMIDE 10 MG/ML IJ SOLN
80.0000 mg | Freq: Once | INTRAMUSCULAR | Status: AC
  Administered 2017-03-24: 80 mg via INTRAVENOUS
  Filled 2017-03-24: qty 8

## 2017-03-24 NOTE — ED Provider Notes (Addendum)
[facility]   CSN: [id number] Arrival date & time: February 08, 2017  1240     History   Chief Complaint Chief Complaint  Patient presents with  . Altered Mental Status    HPI Earl Barber is a 71 y.o. male.  Patient is a 71 year old male with a history of chronic kidney disease, morbid obesity, rheumatoid arthritis, atrial fibrillation on edoxaban and chronic pain who presents with altered mental status.  [facility]  The staff at the facility state that on Friday which was 2 days ago he was at his normal mental status which is alert and oriented and conversational.  Over the weekend he has had progressively a decline in his mental status where he is more confused and delusional.  He is also had episodes of hypoxia and has required some supplemental oxygen.  They have not noticed any fevers vomiting or other recent illnesses.      No past medical history on file.  There are no active problems to display for this patient.   No past surgical history on file.     Home Medications    Prior to Admission medications   Medication Sig Start Date End Date Taking? Authorizing Provider  alfuzosin (UROXATRAL) 10 MG 24 hr tablet Take 10 mg daily with breakfast by mouth.   Yes [provider]  allopurinol (ZYLOPRIM) 300 MG tablet Take 300 mg at bedtime by mouth.   Yes [provider]  ARTIFICIAL TEAR OINTMENT OP Place 1 application daily as needed into both eyes (dry eyes).   Yes [provider]  aspirin 81 MG chewable tablet Chew 81 mg daily by mouth.   Yes [provider]  calcitRIOL (ROCALTROL) 0.25 MCG capsule Take 0.25 mcg every Monday, Wednesday, and Friday by mouth.   Yes [provider]  cyclobenzaprine (FLEXERIL) 5 MG tablet Take 5 mg 2 (two) times daily as needed by mouth for muscle spasms.   Yes [provider]  diclofenac sodium (VOLTAREN) 1 %  GEL Apply 4 g 4 (four) times daily as needed topically (pain).   Yes [provider]  edoxaban (SAVAYSA) 60 MG TABS tablet Take 60 mg daily by mouth.   Yes [provider]  ferrous sulfate 325 (65 FE) MG tablet Take 325 mg daily with breakfast by mouth.   Yes [provider]  furosemide (LASIX) 40 MG tablet Take 40 mg daily by mouth.   Yes [provider]  gabapentin (NEURONTIN) 100 MG capsule Take 100 mg at bedtime by mouth.   Yes [provider]  HYDROcodone-acetaminophen (NORCO/VICODIN) 5-325 MG tablet Take 1 tablet every 6 (six) hours as needed by mouth for moderate pain.   Yes [provider]  lisinopril (PRINIVIL,ZESTRIL) 5 MG tablet Take 5 mg daily by mouth.   Yes [provider]  metoprolol tartrate (LOPRESSOR) 12.5 mg TABS tablet Take 12.5 mg 2 (two) times daily by mouth.   Yes [provider]  morphine (MSIR) 15 MG tablet Take 30-45 mg See admin instructions by mouth. 30mg  by mouth twice daily, 45mg  at bedtime   Yes [provider]  ondansetron (ZOFRAN) 4 MG tablet Take 4 mg every 8 (eight) hours by mouth.   Yes [provider]  pantoprazole (PROTONIX) 40 MG tablet Take 40 mg 2 (two) times daily by mouth.   Yes [provider]  polyethylene glycol (MIRALAX / GLYCOLAX) packet Take 17 g daily by mouth.  Yes [provider]  predniSONE (DELTASONE) 5 MG tablet Take 5 mg daily with breakfast by mouth.   Yes [provider]  ranitidine (ZANTAC) 150 MG tablet Take 150 mg 2 (two) times daily by mouth.   Yes [provider]  sennosides-docusate sodium (SENOKOT-S) 8.6-50 MG tablet Take 1 tablet 2 (two) times daily by mouth.   Yes [provider]  Tocilizumab 162 MG/0.9ML SOSY Inject 0.9 mLs once a week into the skin.   Yes [provider]  venlafaxine XR (EFFEXOR-XR) 150 MG 24 hr capsule Take 150 mg daily with breakfast by mouth.   Yes [provider]  vitamin C (ASCORBIC ACID) 500 MG tablet Take 500 mg daily by mouth.   Yes [provider]    Family History No family history on file.  Social History Social History   Tobacco Use  . Smoking status: Unknown If Ever Smoked  Substance Use Topics  . Alcohol use: Not on file  . Drug use: Not on file     Allergies   Bupropion; Pilocarpine; Prevacid [lansoprazole]; Sulfasalazine; and Sulfamethoxazole   Review of Systems Review of Systems  Unable to perform ROS: Mental status change     Physical Exam Updated Vital Signs BP (!) 78/65   Pulse 77   Temp 99.1 F (37.3 C) (Oral)   Resp (!) 24   Ht 5\' 9"  (1.753 m)   Wt 99.8 kg (220 lb)   SpO2 100%   BMI 32.49 kg/m   Physical Exam  Constitutional: He appears well-developed and well-nourished.  Obese  HENT:  Head: Normocephalic and atraumatic.  Eyes: Pupils are equal, round, and reactive to light.  Neck: Normal range of motion. Neck supple.  Cardiovascular: Normal rate, regular rhythm and normal heart sounds.  Pulmonary/Chest: Effort normal. Tachypnea noted. No respiratory distress. He has no wheezes. He has rhonchi. He has no rales. He exhibits no tenderness.  Abdominal: Soft. Bowel sounds are normal. There is no tenderness. There is no rebound and no guarding.  Genitourinary:  Genitourinary Comments: Sacral decubitus, no signs of surrounding cellulitis  Musculoskeletal: Normal range of motion. He exhibits no edema.  Lymphadenopathy:    He has no cervical adenopathy.  Neurological: He is alert.  He will verbalize but will not answer questions appropriately.  There is no obvious facial drooping.  No obvious unilateral deficits but he is not following commands  Skin: Skin is warm and dry. No rash noted.  Psychiatric: He has a normal mood and affect.     ED Treatments / Results  Labs (all labs ordered are listed, but only abnormal results are displayed) Labs Reviewed  COMPREHENSIVE METABOLIC PANEL -  Abnormal; Notable for the following components:      Result Value   Sodium 132 (*)    Potassium 5.5 (*)    Chloride 97 (*)    Glucose, Bld 128 (*)    BUN 76 (*)    Creatinine, Ser 4.24 (*)    Albumin 2.8 (*)    Alkaline Phosphatase 138 (*)    GFR calc non Af Amer 13 (*)    GFR calc Af Amer 15 (*)    All other components within normal limits  CBC WITH DIFFERENTIAL/PLATELET - Abnormal; Notable for the following components:   WBC 16.5 (*)    RBC 3.06 (*)    Hemoglobin 10.3 (*)    HCT 32.0 (*)    MCV 104.6 (*)    Neutro Abs 13.8 (*)  Monocytes Absolute 1.7 (*)    All other components within normal limits  URINALYSIS, ROUTINE W REFLEX MICROSCOPIC - Abnormal; Notable for the following components:   Color, Urine AMBER (*)    All other components within normal limits  CBG MONITORING, ED - Abnormal; Notable for the following components:   Glucose-Capillary 110 (*)    All other components within normal limits  CULTURE, BLOOD (ROUTINE X 2)  CULTURE, BLOOD (ROUTINE X 2)  URINE CULTURE  BRAIN NATRIURETIC PEPTIDE  BLOOD GAS, VENOUS  BLOOD GAS, ARTERIAL  I-STAT CG4 LACTIC ACID, ED  I-STAT VENOUS BLOOD GAS, ED  I-STAT TROPONIN, ED  I-STAT CG4 LACTIC ACID, ED    EKG  EKG Interpretation  Date/Time:  Sunday 17-Apr-2017 13:00:59 EST Ventricular Rate:  88 PR Interval:    QRS Duration: 151 QT Interval:  387 QTC Calculation: 469 R Axis:   -45 Text Interpretation:  Atrial flutter Left bundle branch block Confirmed by Rolan Bucco [phone]) on 04-17-2017 1:06:32 PM       Radiology Ct Head Wo Contrast  Result Date: 04-17-2017 CLINICAL DATA:  Altered mental status 2 days. EXAM: CT HEAD WITHOUT CONTRAST TECHNIQUE: Contiguous axial images were obtained from the base of the skull through the vertex without intravenous contrast. COMPARISON:  None. FINDINGS: Brain: The ventricles, cisterns and other CSF spaces are within normal. There is minimal chronic ischemic microvascular  disease. There is no mass, mass effect or shift midline structures. No evidence of acute hemorrhage. Moderate region of low-attenuation over the inferior right cerebellum with possible minimal local mass effect likely acute to subacute ischemic change, although chronic ischemic change is possible. Vascular: No hyperdense vessel or unexpected calcification. Skull: Normal. Negative for fracture or focal lesion. Sinuses/Orbits: No acute finding. Other: None. IMPRESSION: Moderate region of low-attenuation of the inferior aspect of the right cerebellar hemisphere likely acute to subacute infarction, although chronic ischemic changes possible. Consider MRI for further evaluation. Minimal chronic ischemic microvascular disease. Electronically Signed   By: Elberta Fortis M.D.   On: 17-Apr-2017 15:53   Dg Chest Portable 1 View  Result Date: 04-17-2017 CLINICAL DATA:  Intubated EXAM: PORTABLE CHEST 1 VIEW COMPARISON:  Chest radiograph from earlier today. FINDINGS: Endotracheal tube tip is 4.5 cm above the carina. Low lung volumes. Stable cardiomediastinal silhouette with top-normal heart size and aortic atherosclerosis. No pneumothorax. No pleural effusion. Linear and hazy parahilar and bibasilar lung opacities, stable. IMPRESSION: 1. Well-positioned endotracheal tube. 2. Low lung volumes. 3. Stable mild cardiomegaly. 4. Stable linear and hazy parahilar and bibasilar lung opacities, favor mild pulmonary edema and atelectasis. Electronically Signed   By: Delbert Phenix M.D.   On: 04-17-2017 16:31   Dg Chest Port 1 View  Result Date: 2017-04-17 CLINICAL DATA:  Altered mental status 2 days. EXAM: PORTABLE CHEST 1 VIEW COMPARISON:  None. FINDINGS: Lungs are somewhat hypoinflated with mild perihilar bibasilar opacification likely interstitial edema. Infection in the lung bases is less likely. No definite effusion. Cardiomediastinal silhouette is within normal. There is calcified plaque over the thoracic aorta. There is mild  degenerate change of the spine. IMPRESSION: Mild perihilar and bibasilar opacification likely interstitial edema. Bibasilar infection is less likely. Electronically Signed   By: Elberta Fortis M.D.   On: April 17, 2017 13:50    Procedures Procedures (including critical care time)  Medications Ordered in ED Medications  sodium chloride 0.9 % bolus 500 mL (500 mLs Intravenous New Bag/Given 04/17/17 1600)  norepinephrine (LEVOPHED) 4 mg in dextrose 5 %  250 mL (0.016 mg/mL) infusion (6 mcg/min Intravenous Rate/Dose Change 04/08/2017 1545)  phenylephrine 0.4-0.9 MG/10ML-% injection (not administered)  sodium chloride 0.9 % bolus 1,000 mL (not administered)  hydrocortisone sodium succinate (SOLU-CORTEF) 100 MG injection 100 mg (not administered)  piperacillin-tazobactam (ZOSYN) IVPB 3.375 g (0 g Intravenous Stopped 04/06/2017 1429)  vancomycin (VANCOCIN) IVPB 1000 mg/200 mL premix (0 mg Intravenous Stopped 04/03/2017 1500)  sodium chloride 0.9 % bolus 500 mL (0 mLs Intravenous Stopped March 30, 2017 1448)  furosemide (LASIX) injection 80 mg (80 mg Intravenous Given 04/01/2017 1531)  phenylephrine (NEO-SYNEPHRINE) injection (50 mcg  Given 30-Mar-2017 1558)  etomidate (AMIDATE) injection (20 mg Intravenous Given 04/12/2017 1600)  rocuronium (ZEMURON) injection (80 mg Intravenous Given March 30, 2017 1601)     Initial Impression / Assessment and Plan / ED Course  I have reviewed the triage vital signs and the nursing notes.  Pertinent labs & imaging results that were available during my care of the patient were reviewed by me and considered in my medical decision making (see chart for details).     Patient is a 71 year old male who presents with altered mental status.  This reportedly started yesterday morning per the nursing home.  They state that he was delusional yesterday and had some intermittent improvement however today he was much worse and less responsive with hypoxia.  On arrival he will verbalize at times but does  not answer questions appropriately.  Chest x-ray showed pneumonia versus CHF.  Initially he was treated more for CHF given his lack of fever and prior history.  He was given a dose of Lasix however he became increasingly hypotensive and was treated with IV fluids.  His blood pressure did not respond after about a liter and half of IV fluids and he was started on Levophed.  He was also given Rocephin and vancomycin for overlying possible infection.  His sepsis markers were negative and he did not have a rectal temperature.  However he was treated for both possible pneumonia and CHF.  He was maintained on Levophed.  He became less responsive in the ED and had snoring respirations.  For this reason he was intubated using RSI intubation.  He was given phenylephrine peri intubation.  His BMP is normal so this likely represents more of a pneumonia rather than congestive failure.  In addition his head CT shows acute versus subacute infarct.  I spoke with Dr. Marlyne Beards with PCC[facility] who will admit the pt to the ICU.  I did note that pt is on chronic steroids, did not get his dose today, will give hydrocortisone given his ongoing hypotension.  Intubation was done by APP under my direct supervision  CRITICAL CARE Performed by: Kelly Eisler Total critical care time: 75 minutes Critical care time was exclusive of separately billable procedures and treating other patients. Critical care was necessary to treat or prevent imminent or life-threatening deterioration. Critical care was time spent personally by me on the following activities: development of treatment plan with patient and/or surrogate as well as nursing, discussions with consultants, evaluation of patient's response to treatment, examination of patient, obtaining history from patient or surrogate, ordering and performing treatments and interventions, ordering and review of laboratory studies, ordering and review of radiographic studies, pulse oximetry and  re-evaluation of patient's condition.   Final Clinical Impressions(s) / ED Diagnoses   Final diagnoses:  Disorientation  HCAP (healthcare-associated pneumonia)  Acute respiratory failure with hypoxia (HCC)  Cerebrovascular accident (CVA), unspecified mechanism [facility])    ED Discharge Orders  None       Rolan Bucco, MD April 01, 2017 1635    Rolan Bucco, MD 04-01-2017 [phone]

## 2017-03-24 NOTE — ED Notes (Signed)
Intensivist @ bedside

## 2017-03-24 NOTE — ED Notes (Signed)
MD bedside

## 2017-03-24 NOTE — ED Notes (Signed)
PT will be transported to ICU after 1930

## 2017-03-24 NOTE — H&P (Signed)
[facility] Attending Consult  Physician Requesting Consult:  Malvin Johns, M.D. / EDP  Date of Consult:  03/15/2017  Reason for Consult/Chief Complaint:  Acute Hypoxic Respiratory Failure  History of Presenting Illness:  History obtained from the emergency department provider as well as the electronic medical record and wife with patient currently intubated. 71 y.o. male with history of chronic renal failure, rheumatoid arthritis, morbid obesity, and atrial fibrillation. Presenting with altered mental status. Currently residing at a local skilled nursing facility. Patient was in his normal state of health approximately 2 days ago oriented and conversational. Over the weekend experienced progressive decline in mental status with noted delusions. Episodes of hypoxia requiring supplemental oxygen were also noted. Staff have not noticed any fever, vomiting, or other illnesses. In the emergency room the patient's respiratory status progressively deteriorated and oxygen requirement continued to increase. In the setting of his continued altered and worsening mentation he was endotracheally intubated. Initially the patient was administered Lasix thinking this could represent a congestive heart failure exacerbation but with the higher probability of healthcare associated pneumonia sepsis protocol was initiated. Patient reportedly hospitalized in October for food impaction despite previous Botox administration for achalasia. Patient was transitioned to rehabilitation last Wednesday. On Friday evening his wife noticed that he was more confused. She reports decreased intake of his usual sustenance: Soups. She denies any aspiration events. Staff did note cough productive of a yellow mucus. He did previously have his influenza vaccine. He is maintained on prednisone 5 mg daily in addition to his monoclonal antibody for treatment of his underlying rheumatoid arthritis. The patient is also on  systemic anticoagulation for his atrial fibrillation.  Review of Systems: Unable to obtain as the patient is intubated.   Allergies  Allergen Reactions  . Bupropion Other (See Comments)    unspecified  . Pilocarpine Other (See Comments)    unspecified  . Prevacid [Lansoprazole] Other (See Comments)    unspecified  . Sulfasalazine Other (See Comments)    unspecified  . Sulfamethoxazole Rash    No current facility-administered medications on file prior to encounter.    Current Outpatient Medications on File Prior to Encounter  Medication Sig Dispense Refill  . alfuzosin (UROXATRAL) 10 MG 24 hr tablet Take 10 mg daily with breakfast by mouth.    Marland Kitchen allopurinol (ZYLOPRIM) 300 MG tablet Take 300 mg at bedtime by mouth.    . ARTIFICIAL TEAR OINTMENT OP Place 1 application daily as needed into both eyes (dry eyes).    Marland Kitchen aspirin 81 MG chewable tablet Chew 81 mg daily by mouth.    . calcitRIOL (ROCALTROL) 0.25 MCG capsule Take 0.25 mcg every Monday, Wednesday, and Friday by mouth.    . cyclobenzaprine (FLEXERIL) 5 MG tablet Take 5 mg 2 (two) times daily as needed by mouth for muscle spasms.    . diclofenac sodium (VOLTAREN) 1 % GEL Apply 4 g 4 (four) times daily as needed topically (pain).    Marland Kitchen edoxaban (SAVAYSA) 60 MG TABS tablet Take 60 mg daily by mouth.    . ferrous sulfate 325 (65 FE) MG tablet Take 325 mg daily with breakfast by mouth.    . furosemide (LASIX) 40 MG tablet Take 40 mg daily by mouth.    . gabapentin (NEURONTIN) 100 MG capsule Take 100 mg at bedtime by mouth.    Marland Kitchen HYDROcodone-acetaminophen (NORCO/VICODIN) 5-325 MG tablet Take 1 tablet every 6 (six) hours as needed by mouth for moderate pain.    Marland Kitchen  lisinopril (PRINIVIL,ZESTRIL) 5 MG tablet Take 5 mg daily by mouth.    . metoprolol tartrate (LOPRESSOR) 12.5 mg TABS tablet Take 12.5 mg 2 (two) times daily by mouth.    . morphine (MSIR) 15 MG tablet Take 30-45 mg See admin instructions by mouth. 64m by mouth twice daily, 421m at bedtime    . ondansetron (ZOFRAN) 4 MG tablet Take 4 mg every 8 (eight) hours by mouth.    . pantoprazole (PROTONIX) 40 MG tablet Take 40 mg 2 (two) times daily by mouth.    . polyethylene glycol (MIRALAX / GLYCOLAX) packet Take 17 g daily by mouth.    . predniSONE (DELTASONE) 5 MG tablet Take 5 mg daily with breakfast by mouth.    . ranitidine (ZANTAC) 150 MG tablet Take 150 mg 2 (two) times daily by mouth.    . sennosides-docusate sodium (SENOKOT-S) 8.6-50 MG tablet Take 1 tablet 2 (two) times daily by mouth.    . Tocilizumab 162 MG/0.9ML SOSY Inject 0.9 mLs once a week into the skin.    . Marland Kitchenenlafaxine XR (EFFEXOR-XR) 150 MG 24 hr capsule Take 150 mg daily with breakfast by mouth.    . vitamin C (ASCORBIC ACID) 500 MG tablet Take 500 mg daily by mouth.      Past Medical History:  Diagnosis Date  . Achalasia   . [place] ([place]  . CHF (congestive heart failure) ([facility]  . Chronic renal failure   . Food impaction of esophagus   . [facility]    Past Surgical History:  Procedure Laterality Date  . [place]. ESOPHAGOGASTRODUODENOSCOPY     repeatedly for food impactions  . ESOPHAGOSCOPY W/ BOTOX INJECTION  01/03/2017    Family History  Problem Relation Age of Onset  . Rheum arthritis Mother   . Congestive Heart Failure Mother   . Rheum arthritis Brother   . Atrial fibrillation Brother     Social History   Socioeconomic History  . Marital status: Married    Spouse name: None  . Number of children: None  . Years of education: None  . Highest education level: None  Social Needs  . Financial resource strain: None  . Food insecurity - worry: None  . Food insecurity - inability: None  . Transportation needs - medical: None  . Transportation needs - non-medical: None  Occupational History  . None  Tobacco Use  . Smoking status: Former Smoker    Packs/day: 2.00    Years: 28.00    Pack years: 56.00    Start date:  07/04/1949    Last attempt to quit: 05/14/1981    Years since quitting: 35.8  Substance and Sexual Activity  . Alcohol use: No    Frequency: Never    Comment: Remote EtOH  . Drug use: No  . Sexual activity: Not Currently  Other Topics Concern  . None  Social History Narrative   [facility] (03/22/2017):   Gets his care through the [facility]Patient married for 41 years. [place]. Remote bird exposure. 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 Was walking short distances with a walker up until recently. Wife CaArbie Cookeyan be reached at [phone]   Vent Mode: PRVC FiO2 (%):  [100 %] 100 % Set Rate:  [16 bmp] 16 bmp Vt Set:  [  570 mL] 570 mL PEEP:  [5 cmH20] 5 cmH20 Plateau Pressure:  [16 cmH20] 16 cmH20  Temp:  [99.1 F (37.3 C)] 99.1 F (37.3 C) (11/11 1254) Pulse Rate:  [77-112] 92 (11/11 1730) Resp:  [14-28] 24 (11/11 1730) BP: (60-119)/(38-91) 94/51 (11/11 1730) SpO2:  [83 %-100 %] 100 % (11/11 1730) FiO2 (%):  [100 %] 100 % (11/11 1608) Weight:  [220 lb (99.8 kg)] 220 lb (99.8 kg) (11/11 1255)  General:  No distress. Wife at bedside. Endotracheally intubated.  Integument:  Warm & dry. Rash and left inguinal skin fold.  Extremities:  No cyanosis or clubbing.  Lymphatics:  No appreciated cervical or supraclavicular lymphadenoapthy. HEENT:  Moist mucus membranes. No scleral icterus. Endotracheal tube in place. Cardiovascular:  Regular rate. No edema. Unable to appreciate JVD. Pulmonary:  Coarse breath sounds bilaterally. Symmetric chest wall rise on ventilator. Abdomen: Soft. Normoactive bowel sounds. Protuberant.  Musculoskeletal:  Normal bulk. No joint effusion appreciated. Neurological:  No meningismus or nuchal rigidity. Pupils symmetric. No spontaneous movements.  Psychiatric:  Unable to assess given intubation and altered  mental status.   LINES/TUBES: OETT 11/11 >>> PIV  CBC Latest Ref Rng & Units 03/28/2017  WBC 4.0 - 10.5 K/uL 16.5(H)  Hemoglobin 13.0 - 17.0 g/dL 10.3(L)  Hematocrit 39.0 - 52.0 % 32.0(L)  Platelets 150 - 400 K/uL 362    BMP Latest Ref Rng & Units 04/12/2017  Glucose 65 - 99 mg/dL 128(H)  BUN 6 - 20 mg/dL 76(H)  Creatinine 0.61 - 1.24 mg/dL 4.24(H)  Sodium 135 - 145 mmol/L 132(L)  Potassium 3.5 - 5.1 mmol/L 5.5(H)  Chloride 101 - 111 mmol/L 97(L)  CO2 22 - 32 mmol/L 24  Calcium 8.9 - 10.3 mg/dL 9.2    Hepatic Function Latest Ref Rng & Units 04/12/2017  Total Protein 6.5 - 8.1 g/dL 6.5  Albumin 3.5 - 5.0 g/dL 2.8(L)  AST 15 - 41 U/L 29  ALT 17 - 63 U/L 29  Alk Phosphatase 38 - 126 U/L 138(H)  Total Bilirubin 0.3 - 1.2 mg/dL 1.2    IMAGING/STUDIES: CT HEAD W/O 11/11:  Moderate region of low-attenuation of the inferior aspect of the right cerebellar hemisphere likely acute to subacute infarction, although chronic ischemic changes possible. Consider MRI for further evaluation. Minimal chronic ischemic microvascular disease. PORT CXR 11/11:  Personally reviewed by me. Endotracheal tube in good position. Hazy bilateral lower lung opacities with some alveolar filling. Unclear if this represents pleural effusion. Mediastinal contour otherwise normal.   MICROBIOLOGY: MRSA PCR 11/11 >>> Blood Cultures x2 11/11 >>> Urine Culture 11/11 >>> Tracheal Aspirate Culture 11/11 >>> Respiratory Viral Panel PCR 11/11 >>> Urine Streptococcal Antigen 11/11 >>> Urine Legionella Antigen 11/11 >>>  ANTIBIOTICS: Zosyn 11/11 >>> Vancomycin 11/11 >>> Azithromycin 11/11 >>>  SIGNIFICANT EVENTS: 11/11 - Admit  ASSESSMENT/PLAN:  71 y.o. male on chronic suppression with monoclonal antibody for rheumatoid arthritis as well as prednisone 5 mg daily. Known history of achalasia and repetitive food impaction presenting with acute encephalopathy and acute hypoxic respiratory failure. Suspect this  represents a healthcare associated pneumonia given the constellation of symptoms. Certainly the patient could've aspirated precipitating this event; however, with his recent admission to Hospital and residence in a facility healthcare associated organisms are of primary concern. Patient currently is hypotensive requiring low-dose peripheral Levophed infusion. This is confounded by his earlier dose of Lasix 80 mg IV. I'm continuing fluid resuscitation in the hope that his vasopressor requirement will resolve. Wife reports full CODE  STATUS.  1. Acute hypoxic respiratory failure: Continuing full ventilator support. Repeat ABG and portable chest x-ray in a.m. Treatment of underlying pneumonia as follows. 2. Acute encephalopathy: Likely multifactorial from hypoxia and hypotension. Also suspect some element of toxic metabolic encephalopathy. Utilizing intermittent sedation. 3. Septic shock: Weaning vasopressor infusion. Continuing IV fluid resuscitation with 2 L normal saline bolus. Monitoring vitals per unit protocol. Trending Procalcitonin and lactic acid. Additional infectious workup as above and below. 4. Healthcare associated pneumonia: Continuing patient on vancomycin and Zosyn. Adding azithromycin for atypical coverage. Checking urine streptococcal and legionella antigens. Checking tracheal aspirate culture. Checking respiratory viral panel PCR. 5. Chronic renal failure: Suspect his elevated creatinine represents acute on chronic renal failure. Continuing fluid resuscitation. Trying to avoid nephrotoxic agents. Trending electrolytes daily. Placing a Foley catheter to monitor urine output. 6. Hyperkalemia: Status post IV Lasix. Monitoring patient on telemetry. Repeat electrolyte panel at 2300. 7. Elevated troponin I: Suspect subendocardial ischemia in the setting of shock and hypoxia. Trending troponin I. Checking echocardiogram. 8. Anemia: No signs of active bleeding. Trending cell counts with repeat CBC  in a.m. 9. Coagulopathy: Patient currently on oral anticoagulation. Checking INR & PTT. Stat type and screen. Holding home anticoagulant. 10. Atrial fibrillation: Monitoring patient on telemetry. Holding systemic anticoagulation. 11. Probable adrenal insufficiency: Initiating stress dosed steroids with hydrocortisone 50 mg IV every 6 hours.  Prophylaxis: Heparin subcutaneous every 8 hours starting tomorrow, SCDs, and Protonix/Pepcid IV twice a day. Diet:  NPO. Holding on tube feedings. Code Status: Full code per my discussion with wife today. Disposition:  Admitting patient to ICU. Consider transitioning to [facility] in a.m. Family Update: Wife updated at bedside at the time of my exam.  I have personally spent a total of 32 minutes of critical care time today caring for the patient, updating wife at bedside, & reviewing the patient's electronic medical record.  Sonia Baller Ashok Cordia, M.D. [facility] Pager:  [phone] After 7pm or if no response, call [phone] 5:56 PM 03/28/2017

## 2017-03-24 NOTE — ED Notes (Signed)
Xray bedside.

## 2017-03-24 NOTE — ED Notes (Signed)
Dr.Belfi shown results of VBG

## 2017-03-24 NOTE — Progress Notes (Signed)
Patient transported from ED to 2M02 without any complications. 

## 2017-03-24 NOTE — ED Triage Notes (Signed)
Pt arrives to ED from [facility] with complaints of Altered mental status x2 days. Pt is a&ox4 baseline. Pt mostly nonverbal, stating he has lower back pain upon arrival. Pt placed in position of comfort with bed locked and lowered, call bell in reach.

## 2017-03-24 NOTE — ED Notes (Signed)
Pt transported to 57M with RN, respiratory, and ED tech

## 2017-03-24 NOTE — ED Notes (Signed)
Dr. Fredderick Phenix made aware of pt's BP 73/38

## 2017-03-24 NOTE — ED Provider Notes (Signed)
  Physical Exam  BP (!) 76/56 (BP Location: Left Arm)   Pulse 87   Temp 99.1 F (37.3 C) (Oral)   Resp 19   Ht _0  (1.753 m)   Wt 99.8 kg (220 lb)   SpO2 100%   BMI 32.49 kg/m   Physical Exam  ED Course  Procedure Name: Intubation Date/Time: 04/07/2017 4:06 PM Performed by: Shary Decamp, PA-C Pre-anesthesia Checklist: Patient identified, Emergency Drugs available, Suction available, Patient being monitored and Timeout performed Oxygen Delivery Method: Non-rebreather mask Preoxygenation: Pre-oxygenation with 100% oxygen Induction Type: Rapid sequence and IV induction Ventilation: Mask ventilation without difficulty Laryngoscope Size: Mac, Glidescope and 4 Grade View: Grade II Tube type: Non-subglottic suction tube Tube size: 7.5 mm Number of attempts: 1 Airway Equipment and Method: Patient positioned with wedge pillow Placement Confirmation: ETT inserted through vocal cords under direct vision,  Positive ETCO2,  CO2 detector and Breath sounds checked- equal and bilateral Secured at: 23 cm Tube secured with: ETT holder Dental Injury: Teeth and Oropharynx as per pre-operative assessment  Comments: Successful Intubation. Lungs CTA          Shary Decamp, PA-C 03/20/2017 1617    Malvin Johns, MD 04/09/2017 [phone]

## 2017-03-24 NOTE — Sedation Documentation (Addendum)
PT intubated, 23 cm at the lip 7.5  ETT

## 2017-03-24 NOTE — Progress Notes (Addendum)
Pharmacy Antibiotic Note  Earl Barber is a 71 y.o. male admitted on 04/22/2017 with pneumonia.  Pharmacy has been consulted for vancomycin and zosyn dosing.  Patient presenting with acute hypoxic respiratory failure and altered mental status. Has hx of CKD, RA (on chronic steroids), and currently is residing at [facility]. Received loading dose of vancomycin and dose of zosyn in ED. WBC 16.5 at admission. Renal function elevated (SCr 4.24, CrCl ~18 mL/min).   Plan: Vancomycin 1000 mg IV every 48 hours.  Goal trough 15-20 mcg/mL. Zosyn 2.25 g IV every 6 hours  Azithromycin 500 mg IV every 24 hours Monitor renal function, cx results, clinical status, and VT as needed  Height: 5\' 9"  (175.3 cm) Weight: 220 lb (99.8 kg) IBW/kg (Calculated) : 70.7  Temp (24hrs), Avg:99.1 F (37.3 C), Min:99.1 F (37.3 C), Max:99.1 F (37.3 C)  Recent Labs  Lab 04/22/17 1305 04-22-2017 1321 04-22-2017 1456  WBC 16.5*  --   --   CREATININE 4.24*  --   --   LATICACIDVEN  --  1.01 0.76    Estimated Creatinine Clearance: 18.1 mL/min (A) (by C-G formula based on SCr of 4.24 mg/dL (H)).    Allergies  Allergen Reactions  . Bupropion Other (See Comments)    unspecified  . Pilocarpine Other (See Comments)    unspecified  . Prevacid [Lansoprazole] Other (See Comments)    unspecified  . Sulfasalazine Other (See Comments)    unspecified  . Sulfamethoxazole Rash    Antimicrobials this admission: Zosyn 11/11 >>  Vancomycin 11/11 >>   Dose adjustments this admission: N/A  Microbiology results: 11/11 BCx: sent 11/11 UCx: sent  11/11 Sputum: ordered   Thank you for allowing pharmacy to be a part of this patient's care.  13/11, PharmD Clinical Pharmacist  Pager: [phone] Phone: [phone] Apr 22, 2017 6:32 PM

## 2017-03-24 NOTE — Progress Notes (Signed)
12-Apr-2017  10:03 PM   Unable to place OGT. Patient desats.  Multiple attempts. No gastric contents. MD aware. Patient has achalasia.     Carlyon Prows RN

## 2017-03-24 NOTE — ED Notes (Addendum)
Phlebotomy to draw blood cultures x2 Xray bedside

## 2017-03-25 ENCOUNTER — Inpatient Hospital Stay (HOSPITAL_COMMUNITY): Payer: Medicare HMO

## 2017-03-25 ENCOUNTER — Encounter (HOSPITAL_COMMUNITY): Payer: Self-pay

## 2017-03-25 ENCOUNTER — Other Ambulatory Visit: Payer: Self-pay

## 2017-03-25 DIAGNOSIS — I4891 Unspecified atrial fibrillation: Secondary | ICD-10-CM

## 2017-03-25 DIAGNOSIS — L8990 Pressure ulcer of unspecified site, unspecified stage: Secondary | ICD-10-CM

## 2017-03-25 DIAGNOSIS — J9601 Acute respiratory failure with hypoxia: Secondary | ICD-10-CM

## 2017-03-25 LAB — BLOOD GAS, ARTERIAL
ACID-BASE DEFICIT: 4.5 mmol/L — AB (ref 0.0–2.0)
ACID-BASE DEFICIT: 4 mmol/L — AB (ref 0.0–2.0)
Bicarbonate: 21.1 mmol/L (ref 20.0–28.0)
Bicarbonate: 20.8 mmol/L (ref 20.0–28.0)
DRAWN BY: 24513
DRAWN BY: 24513
FIO2: 50
FIO2: 50
MECHVT: 570 mL
O2 SAT: 92.4 %
O2 Saturation: 93.1 %
PCO2 ART: 39.9 mmHg (ref 32.0–48.0)
PEEP/CPAP: 5 cmH2O
PEEP/CPAP: 8 cmH2O
PH ART: 7.279 — AB (ref 7.350–7.450)
PH ART: 7.338 — AB (ref 7.350–7.450)
PO2 ART: 67 mmHg — AB (ref 83.0–108.0)
Patient temperature: 98.6
Patient temperature: 98.6
RATE: 16 resp/min
RATE: 22 resp/min
VT: 570 mL
pCO2 arterial: 46.5 mmHg (ref 32.0–48.0)
pO2, Arterial: 70.4 mmHg — ABNORMAL LOW (ref 83.0–108.0)

## 2017-03-25 LAB — URINE CULTURE: Culture: NO GROWTH

## 2017-03-25 LAB — RESPIRATORY PANEL BY PCR
ADENOVIRUS-RVPPCR: NOT DETECTED
Bordetella pertussis: NOT DETECTED
CORONAVIRUS 229E-RVPPCR: NOT DETECTED
CORONAVIRUS HKU1-RVPPCR: NOT DETECTED
CORONAVIRUS NL63-RVPPCR: NOT DETECTED
CORONAVIRUS OC43-RVPPCR: NOT DETECTED
Chlamydophila pneumoniae: NOT DETECTED
INFLUENZA B-RVPPCR: NOT DETECTED
Influenza A: NOT DETECTED
MYCOPLASMA PNEUMONIAE-RVPPCR: NOT DETECTED
Metapneumovirus: NOT DETECTED
PARAINFLUENZA VIRUS 1-RVPPCR: NOT DETECTED
PARAINFLUENZA VIRUS 2-RVPPCR: NOT DETECTED
Parainfluenza Virus 3: NOT DETECTED
Parainfluenza Virus 4: NOT DETECTED
Respiratory Syncytial Virus: NOT DETECTED
Rhinovirus / Enterovirus: NOT DETECTED

## 2017-03-25 LAB — CBC
HEMATOCRIT: 32 % — AB (ref 39.0–52.0)
Hemoglobin: 10.3 g/dL — ABNORMAL LOW (ref 13.0–17.0)
MCH: 33.6 pg (ref 26.0–34.0)
MCHC: 32.2 g/dL (ref 30.0–36.0)
MCV: 104.2 fL — AB (ref 78.0–100.0)
Platelets: 372 10*3/uL (ref 150–400)
RBC: 3.07 MIL/uL — AB (ref 4.22–5.81)
RDW: 15 % (ref 11.5–15.5)
WBC: 16.2 10*3/uL — AB (ref 4.0–10.5)

## 2017-03-25 LAB — GLUCOSE, CAPILLARY
GLUCOSE-CAPILLARY: 165 mg/dL — AB (ref 65–99)
GLUCOSE-CAPILLARY: 122 mg/dL — AB (ref 65–99)
GLUCOSE-CAPILLARY: 108 mg/dL — AB (ref 65–99)
GLUCOSE-CAPILLARY: 147 mg/dL — AB (ref 65–99)
GLUCOSE-CAPILLARY: 112 mg/dL — AB (ref 65–99)
Glucose-Capillary: 138 mg/dL — ABNORMAL HIGH (ref 65–99)

## 2017-03-25 LAB — BASIC METABOLIC PANEL
Anion gap: 13 (ref 5–15)
BUN: 70 mg/dL — ABNORMAL HIGH (ref 6–20)
CHLORIDE: 101 mmol/L (ref 101–111)
CO2: 21 mmol/L — AB (ref 22–32)
Calcium: 8.8 mg/dL — ABNORMAL LOW (ref 8.9–10.3)
Creatinine, Ser: 3.52 mg/dL — ABNORMAL HIGH (ref 0.61–1.24)
GFR calc non Af Amer: 16 mL/min — ABNORMAL LOW (ref >60)
GFR, EST AFRICAN AMERICAN: 18 mL/min — AB (ref >60)
Glucose, Bld: 139 mg/dL — ABNORMAL HIGH (ref 65–99)
POTASSIUM: 5 mmol/L (ref 3.5–5.1)
SODIUM: 135 mmol/L (ref 135–145)

## 2017-03-25 LAB — MAGNESIUM: MAGNESIUM: 2.1 mg/dL (ref 1.7–2.4)

## 2017-03-25 LAB — TROPONIN I: Troponin I: <0.03 ng/mL (ref <0.03)

## 2017-03-25 LAB — PROCALCITONIN: Procalcitonin: 0.78 ng/mL

## 2017-03-25 LAB — ECHOCARDIOGRAM COMPLETE
Height: 69 in
WEIGHTICAEL: 4268.11 [oz_av]

## 2017-03-25 LAB — ABO/RH: ABO/RH(D): O POS

## 2017-03-25 LAB — LACTIC ACID, PLASMA
Lactic Acid, Venous: 1.1 mmol/L (ref 0.5–1.9)
Lactic Acid, Venous: 1.5 mmol/L (ref 0.5–1.9)

## 2017-03-25 LAB — MRSA PCR SCREENING: MRSA by PCR: NEGATIVE

## 2017-03-25 LAB — PHOSPHORUS: PHOSPHORUS: 4.4 mg/dL (ref 2.5–4.6)

## 2017-03-25 MED ORDER — FAMOTIDINE IN NACL 20-0.9 MG/50ML-% IV SOLN
20.0000 mg | INTRAVENOUS | Status: DC
  Administered 2017-03-26 – 2017-03-28 (×3): 20 mg via INTRAVENOUS
  Filled 2017-03-25 (×4): qty 50

## 2017-03-25 MED ORDER — ASPIRIN 81 MG PO CHEW: 81.0000 mg | CHEWABLE_TABLET | Freq: Every day | ORAL | Status: DC

## 2017-03-25 MED ORDER — SODIUM CHLORIDE 0.9 % IV SOLN: 0.2000 ug/kg/h | INTRAVENOUS | Status: DC

## 2017-03-25 MED ORDER — PIPERACILLIN-TAZOBACTAM 3.375 G IVPB
3.3750 g | Freq: Three times a day (TID) | INTRAVENOUS | Status: DC
  Administered 2017-03-25 – 2017-03-26 (×5): 3.375 g via INTRAVENOUS
  Filled 2017-03-25 (×7): qty 50

## 2017-03-25 MED ORDER — PERFLUTREN LIPID MICROSPHERE
1.0000 mL | INTRAVENOUS | Status: DC | PRN
  Administered 2017-03-25: 2 mL via INTRAVENOUS

## 2017-03-25 MED ORDER — DEXMEDETOMIDINE HCL IN NACL 400 MCG/100ML IV SOLN
0.2000 ug/kg/h | INTRAVENOUS | Status: DC
  Administered 2017-03-25: 0.2 ug/kg/h via INTRAVENOUS
  Administered 2017-03-26 (×4): 0.4 ug/kg/h via INTRAVENOUS
  Administered 2017-03-27: 0.7 ug/kg/h via INTRAVENOUS
  Administered 2017-03-27: 0.4 ug/kg/h via INTRAVENOUS
  Filled 2017-03-25 (×5): qty 100

## 2017-03-25 MED ORDER — SODIUM CHLORIDE 0.9 % IV SOLN
0.0000 ug/min | INTRAVENOUS | Status: DC
  Administered 2017-03-25: 20 ug/min via INTRAVENOUS
  Administered 2017-03-26: 200 ug/min via INTRAVENOUS
  Administered 2017-03-26: 180 ug/min via INTRAVENOUS
  Administered 2017-03-26: 45 ug/min via INTRAVENOUS
  Administered 2017-03-26: 180 ug/min via INTRAVENOUS
  Administered 2017-03-26: 120 ug/min via INTRAVENOUS
  Administered 2017-03-26: 200 ug/min via INTRAVENOUS
  Administered 2017-03-28: 5 ug/min via INTRAVENOUS
  Filled 2017-03-25 (×9): qty 1

## 2017-03-25 MED ORDER — IOPAMIDOL (ISOVUE-300) INJECTION 61%
INTRAVENOUS | Status: AC
  Administered 2017-03-26: 1 mL
  Filled 2017-03-25: qty 50

## 2017-03-25 NOTE — Progress Notes (Signed)
eLink Physician-Brief Progress Note Patient Name: Earl Barber DOB: 02-Apr-1946 MRN: [id number]   Date of Service  03/25/2017  HPI/Events of Note  Multiple issues: Hypotension - BP = 86/55 by cuff. Nurse unsure of cuff accuracy and 2. Delirium - Patient attempting to grab ETT.   eICU Interventions  Will order: 1. Respiratory Therapy to place A-line. 2. Precedex IV infusion. Titrate to RASS = 0 to -1.  3. Phenylephrine IV infusion. Titrate to MAP > 65.      Intervention Category Major Interventions: Hypotension - evaluation and management;Delirium, psychosis, severe agitation - evaluation and management  Sidni Fusco [name] 03/25/2017, 9:55 PM

## 2017-03-25 NOTE — Progress Notes (Signed)
  Echocardiogram 2D Echocardiogram with Definity has been performed.  Earl Barber 03/25/2017, 10:12 AM

## 2017-03-25 NOTE — Progress Notes (Signed)
04/06/2017   Multiple areas of MASD bilateral groin, bilateral axilla, perineum and gluteal folds. Stage 2 to sacrum as well as a separate open area at gluteal cleft. Areas cleaned and dried. Barrier cream applied as a appropriate. Sacral foam applied. WOC consult placed.     Carlyon Prows RN

## 2017-03-25 NOTE — Progress Notes (Signed)
PULMONARY / CRITICAL CARE MEDICINE   Name: Earl Barber MRN: [id number] DOB: 15-Sep-1945    ADMISSION DATE:  03/15/2017 CONSULTATION DATE:  03/18/2017  REFERRING MD:  Malvin Johns, M.D. / EDP  CHIEF COMPLAINT:  Acute hypoxic respiratory failure  HISTORY OF PRESENT ILLNESS:  History obtained from the emergency department provider as well as the electronic medical record and wife with patient currently intubated. 71 y.o. male with history of chronic renal failure, rheumatoid arthritis, morbid obesity, and atrial fibrillation. Presenting with altered mental status. Currently residing at a local skilled nursing facility. Patient was in his normal state of health approximately 2 days ago oriented and conversational. Over the weekend experienced progressive decline in mental status with noted delusions. Episodes of hypoxia requiring supplemental oxygen were also noted. Staff have not noticed any fever, vomiting, or other illnesses. In the emergency room the patient's respiratory status progressively deteriorated and oxygen requirement continued to increase. In the setting of his continued altered and worsening mentation he was endotracheally intubated. Initially the patient was administered Lasix thinking this could represent a congestive heart failure exacerbation but with the higher probability of healthcare associated pneumonia sepsis protocol was initiated. Patient reportedly hospitalized in October for food impaction despite previous Botox administration for achalasia. Patient was transitioned to rehabilitation last Wednesday. On Friday evening his wife noticed that he was more confused. She reports decreased intake of his usual sustenance: Soups. She denies any aspiration events. Staff did note cough productive of a yellow mucus. He did previously have his influenza vaccine. He is maintained on prednisone 5 mg daily in addition to his monoclonal antibody for treatment of his underlying rheumatoid  arthritis. The patient is also on systemic anticoagulation for his atrial fibrillation.  PAST MEDICAL HISTORY :  He  has a past medical history of Achalasia, Atrial fibrillation ([place]), CHF (congestive heart failure) ([place]), Chronic renal failure, Food impaction of esophagus, and Rheumatoid arthritis ([place]).  PAST SURGICAL HISTORY: He  has a past surgical history that includes Ankle fracture surgery (1978); Esophagogastroduodenoscopy; and Esophagoscopy w/botox injection (01/03/2017).  Allergies  Allergen Reactions  . Bupropion Other (See Comments)    unspecified  . Pilocarpine Other (See Comments)    unspecified  . Prevacid [Lansoprazole] Other (See Comments)    unspecified  . Sulfasalazine Other (See Comments)    unspecified  . Sulfamethoxazole Rash    No current facility-administered medications on file prior to encounter.    Current Outpatient Medications on File Prior to Encounter  Medication Sig  . alfuzosin (UROXATRAL) 10 MG 24 hr tablet Take 10 mg daily with breakfast by mouth.  Marland Kitchen allopurinol (ZYLOPRIM) 300 MG tablet Take 300 mg at bedtime by mouth.  . ARTIFICIAL TEAR OINTMENT OP Place 1 application daily as needed into both eyes (dry eyes).  Marland Kitchen aspirin 81 MG chewable tablet Chew 81 mg daily by mouth.  . calcitRIOL (ROCALTROL) 0.25 MCG capsule Take 0.25 mcg every Monday, Wednesday, and Friday by mouth.  . cyclobenzaprine (FLEXERIL) 5 MG tablet Take 5 mg 2 (two) times daily as needed by mouth for muscle spasms.  . diclofenac sodium (VOLTAREN) 1 % GEL Apply 4 g 4 (four) times daily as needed topically (pain).  Marland Kitchen edoxaban (SAVAYSA) 60 MG TABS tablet Take 60 mg daily by mouth.  . ferrous sulfate 325 (65 FE) MG tablet Take 325 mg daily with breakfast by mouth.  . furosemide (LASIX) 40 MG tablet Take 40 mg daily by mouth.  . gabapentin (NEURONTIN) 100 MG capsule Take 100  mg at bedtime by mouth.  Marland Kitchen. HYDROcodone-acetaminophen (NORCO/VICODIN) 5-325 MG tablet Take 1 tablet every 6 (six)  hours as needed by mouth for moderate pain.  Marland Kitchen. lisinopril (PRINIVIL,ZESTRIL) 5 MG tablet Take 5 mg daily by mouth.  . metoprolol tartrate (LOPRESSOR) 12.5 mg TABS tablet Take 12.5 mg 2 (two) times daily by mouth.  . morphine (MSIR) 15 MG tablet Take 30-45 mg See admin instructions by mouth. 30mg  by mouth twice daily, 45mg  at bedtime  . ondansetron (ZOFRAN) 4 MG tablet Take 4 mg every 8 (eight) hours by mouth.  . pantoprazole (PROTONIX) 40 MG tablet Take 40 mg 2 (two) times daily by mouth.  . polyethylene glycol (MIRALAX / GLYCOLAX) packet Take 17 g daily by mouth.  . predniSONE (DELTASONE) 5 MG tablet Take 5 mg daily with breakfast by mouth.  . ranitidine (ZANTAC) 150 MG tablet Take 150 mg 2 (two) times daily by mouth.  . sennosides-docusate sodium (SENOKOT-S) 8.6-50 MG tablet Take 1 tablet 2 (two) times daily by mouth.  . Tocilizumab 162 MG/0.9ML SOSY Inject 0.9 mLs once a week into the skin.  Marland Kitchen. venlafaxine XR (EFFEXOR-XR) 150 MG 24 hr capsule Take 150 mg daily with breakfast by mouth.  . vitamin C (ASCORBIC ACID) 500 MG tablet Take 500 mg daily by mouth.    FAMILY HISTORY:  His indicated that the status of his mother is unknown. He indicated that the status of his brother is unknown.   SOCIAL HISTORY: He  reports that he quit smoking about 35 years ago. He started smoking about 67 years ago. He has a 56.00 pack-year smoking history. He does not have any smokeless tobacco history on file. He reports that he does not drink alcohol or use drugs.  REVIEW OF SYSTEMS:   Patient intubated but able to endorse generalized malaise. Denies chest pain, or nausea.  SUBJECTIVE:  No acute events overnight. Afebrile with stable vitals off pressor support. Tolerating ETT.   VITAL SIGNS: BP 105/75   Pulse (!) 111   Temp 98.5 F (36.9 C) (Oral)   Resp (!) 23   Ht 5\' 9"  (1.753 m)   Wt 266 lb 12.1 oz (121 kg)   SpO2 92%   BMI 39.39 kg/m   HEMODYNAMICS:    VENTILATOR SETTINGS: Vent Mode:  PRVC FiO2 (%):  [50 %-100 %] 50 % Set Rate:  [16 bmp-22 bmp] 22 bmp Vt Set:  [570 mL] 570 mL PEEP:  [5 cmH20-8 cmH20] 8 cmH20 Plateau Pressure:  [15 cmH20-17 cmH20] 17 cmH20  INTAKE / OUTPUT: I/O last 3 completed shifts: In: 586.8 [I.V.:186.8; IV Piggyback:400] Out: 1500 [Urine:1500] UOP 1.5 L last 24 hours  PHYSICAL EXAMINATION: General: ill-appearing male lying in bed, NAD with non-toxic appearance HEENT: normocephalic, atraumatic, moist mucous membranes, PERRLA Neck: supple, non-tender without lymphadenopathy Cardiovascular: regular rate and rhythm without murmurs, rubs, or gallops Lungs: coarse breath sounds bilaterally with normal work of breathing on ventilator Abdomen: obese, soft, non-tender, non-distended, normoactive bowel sounds Skin: warm, dry, no rashes or lesions, cap refill < 2 seconds Extremities: warm and well perfused, normal tone, no edema Neuro: sedated while intubated, gross motor function intact on al 4 extremities Psych: mildly agitated when touched but able to orient  LABS:  BMET Recent Labs  Lab 24-Oct-2016 1305 24-Oct-2016 2041 03/25/17 0607  NA 132* 134* 135  K 5.5* 5.0 5.0  CL 97* 100* 101  CO2 24 22 21*  BUN 76* 71* 70*  CREATININE 4.24* 3.89* 3.52*  GLUCOSE 128*  185* 139*    Electrolytes Recent Labs  Lab 28-Mar-2017 1305 03/28/17 2041 03/25/17 0607  CALCIUM 9.2 8.7* 8.8*  MG  --  2.2 2.1  PHOS  --  5.0* 4.4    CBC Recent Labs  Lab 2017-03-28 1305 03/25/17 0607  WBC 16.5* 16.2*  HGB 10.3* 10.3*  HCT 32.0* 32.0*  PLT 362 372    Coag's Recent Labs  Lab 03/28/2017 2041  APTT 51*  INR 2.66    Sepsis Markers Recent Labs  Lab 03/28/17 2041 03/25/17 0138 03/25/17 0607  LATICACIDVEN 1.3 1.1 1.5  PROCALCITON 0.66  --   --     ABG Recent Labs  Lab 03/28/17 1717 03/25/17 0347 03/25/17 0601  PHART 7.296* 7.279* 7.338*  PCO2ART 53.8* 46.5 39.9  PO2ART 146.0* 70.4* 67.0*    Liver Enzymes Recent Labs  Lab 03-28-2017 1305   AST 29  ALT 29  ALKPHOS 138*  BILITOT 1.2  ALBUMIN 2.8*    Cardiac Enzymes Recent Labs  Lab 03/28/2017 2041 03/25/17 0139 03/25/17 0607  TROPONINI <0.03 <0.03 <0.03    Glucose Recent Labs  Lab Mar 28, 2017 1301 28-Mar-2017 2348 03/25/17 0413 03/25/17 0722  GLUCAP 110* 171* 165* 122*    Imaging Ct Head Wo Contrast  Result Date: 03/28/17 CLINICAL DATA:  Altered mental status 2 days. EXAM: CT HEAD WITHOUT CONTRAST TECHNIQUE: Contiguous axial images were obtained from the base of the skull through the vertex without intravenous contrast. COMPARISON:  None. FINDINGS: Brain: The ventricles, cisterns and other CSF spaces are within normal. There is minimal chronic ischemic microvascular disease. There is no mass, mass effect or shift midline structures. No evidence of acute hemorrhage. Moderate region of low-attenuation over the inferior right cerebellum with possible minimal local mass effect likely acute to subacute ischemic change, although chronic ischemic change is possible. Vascular: No hyperdense vessel or unexpected calcification. Skull: Normal. Negative for fracture or focal lesion. Sinuses/Orbits: No acute finding. Other: None. IMPRESSION: Moderate region of low-attenuation of the inferior aspect of the right cerebellar hemisphere likely acute to subacute infarction, although chronic ischemic changes possible. Consider MRI for further evaluation. Minimal chronic ischemic microvascular disease. Electronically Signed   By: Elberta Fortis M.D.   On: 28-Mar-2017 15:53   Dg Chest Port 1 View  Result Date: 03/25/2017 CLINICAL DATA:  Assess endotracheal tube. Acute hypoxic respiratory failure. EXAM: PORTABLE CHEST 1 VIEW COMPARISON:  One-view chest x-ray 03-28-2017. FINDINGS: The heart is enlarged. Mild edema is increasing. Bibasilar airspace disease is present. Endotracheal tube is stable, 5.5 cm above the carina. Bilateral pleural effusions are present. Lung volumes are low. IMPRESSION: 1.  Stable endotracheal tube. 2. Stable cardiomegaly with increasing bilateral edema, left greater than right pleural effusions, and bibasilar airspace disease. This likely represents atelectasis and congestive heart failure. Infection is considered less likely. Electronically Signed   By: Marin Roberts M.D.   On: 03/25/2017 07:11   Dg Chest Portable 1 View  Result Date: March 28, 2017 CLINICAL DATA:  Intubated EXAM: PORTABLE CHEST 1 VIEW COMPARISON:  Chest radiograph from earlier today. FINDINGS: Endotracheal tube tip is 4.5 cm above the carina. Low lung volumes. Stable cardiomediastinal silhouette with top-normal heart size and aortic atherosclerosis. No pneumothorax. No pleural effusion. Linear and hazy parahilar and bibasilar lung opacities, stable. IMPRESSION: 1. Well-positioned endotracheal tube. 2. Low lung volumes. 3. Stable mild cardiomegaly. 4. Stable linear and hazy parahilar and bibasilar lung opacities, favor mild pulmonary edema and atelectasis. Electronically Signed   By: Jannifer Rodney.D.  On: April 20, 2017 16:31   Dg Chest Port 1 View  Result Date: 20-Apr-2017 CLINICAL DATA:  Altered mental status 2 days. EXAM: PORTABLE CHEST 1 VIEW COMPARISON:  None. FINDINGS: Lungs are somewhat hypoinflated with mild perihilar bibasilar opacification likely interstitial edema. Infection in the lung bases is less likely. No definite effusion. Cardiomediastinal silhouette is within normal. There is calcified plaque over the thoracic aorta. There is mild degenerate change of the spine. IMPRESSION: Mild perihilar and bibasilar opacification likely interstitial edema. Bibasilar infection is less likely. Electronically Signed   By: Elberta Fortis M.D.   On: April 20, 2017 13:50     STUDIES:  CXR 11/11 >> mild perihilar and bibasilar opacification likely interstitial edema, less likely infectious CT head WO 11/11 >> moderate region of low attenuation in the inferior aspect of right cerebellar hemisphere likely  acute to subacute infarction, although chronic ischemia changes possible, recommending MRI, minimal chronic ischemic microvascular disease evident CXR 11/11 >> stable ETT, low lung volumes, stable mild cardiomegaly, stable linear and hazy perihilar and bibasilar lung opacities favoring mild pulmonary edema and atelectasis CXR 11/12 >> stable ETT, stable cardiomegaly with increased bilateral edema, left greater than right pleural effusion, bibasilar airspace disease likely representing atelectasis in CHF, less likely infectious  CULTURES: Respiratory culture 11/12 >> abundant WBC, predominantly PMNs, abundant GPC, culture pending Blood culture 11/11 >> pending Urine culture 11/11 >> pending  ANTIBIOTICS: Azithromycin 11/11 Vancomycin IV 11/11 Zosyn IV 11/11 >>  SIGNIFICANT EVENTS: 11/11 >> admit for AMS requiring intubation  LINES/TUBES: ETT 11/11 >> PIV L-hand 11/11 >> PIV L-wrist 11/11 >> PIV L-forearm 11/11 >> Urethral catheter 11/11 >>  DISCUSSION: Patient is a 71 year old male on chronic suppression with monoclonal antibody for rheumatoid arthritis as well as prednisone 5 mg daily and known achalasia with repetitive food impaction presenting with acute encephalopathy and acute hypoxic respiratory failure with hypotension suspected to be secondary to HAP requiring intubation and pressor support.  ASSESSMENT / PLAN:  PULMONARY Acute hypoxic respiratory failure - Continue full vent support - Repeat ABG - Repeat CXR - Treatment for HAP per below - Continuous pulse ox  - Oral hygiene   CARDIOVASCULAR Elevated troponin I: Suspected cardiac ischemia in the setting of shock and hypoxia Atrial fibrillation Hypotension: off levophed 11/12 - Telemetry - Daily weights and I&O - Trending troponin - Echocardiogram pending - Holding anticoagulant  RENAL   Acute on chronic renal failure: Suspected to be secondary to hypotension, improving (BL ) Hyperkalemia  - Continuing fluid  resuscitation - Avoid nephrotoxic agents - Trend electrolytes daily - Foley cath, daily weight, I&O - Replete electrolytes as indicated - Telemetry - KVO  GASTROINTESTINAL Nutrition Achalasia on liquid diet at home - Protonix IV for SUP - NPO  HEMATOLOGIC Anemia: Stable without signs of active bleeding Coagulopathy with chronic oral anticoagulation - Trending CBC, INR and PTT - Holding home anticoagulant - Heparin SQ  INFECTIOUS   Septic shock secondary to healthcare acquired pneumonia  - Continue Zosyn IV Q6H day 2  - Continue Vancomycin IV every 48 hours day 2 - Urine streptococcal and Legionella antigens - Follow-up tracheal aspirate culture - Checking RVP PCR - Trend fever curve, WBC, procalcitonin - Droplet precautions  ENDOCRINE Suspected adrenal insufficiency Chronic glucose corticoid use  - Continue stress dose steroids with hydrocortisone 50 mg IV every 6 hours  NEUROLOGIC  Acute encephalopathy: Likely multifactorial from toxic state, hypoxia, and hypotension - RASS goal: 0 - Versed 2 mg Q1H PRN / fentanyl 50 mcg Q1H  PRN   FAMILY  - Updates: wife at bedside this morning - Inter-disciplinary family meet or Palliative Care meeting due by: 03/31/2017   Durward Parcel, DO [place]

## 2017-03-25 NOTE — Progress Notes (Signed)
eLink Physician-Brief Progress Note Patient Name: Earl Barber DOB: [birth year] MRN: [id number]   Date of Service  03/25/2017  HPI/Events of Note  Hyperglycemia - Blood glucose = 171. Patient is on steroids.   eICU Interventions  Will order: 1. Q 4 hour sensitive Novolog SSI.      Intervention Category Major Interventions: Hyperglycemia - active titration of insulin therapy  Mayukha Symmonds [name] 03/25/2017, 12:00 AM

## 2017-03-25 NOTE — Progress Notes (Signed)
Initial Nutrition Assessment  DOCUMENTATION CODES:   Obesity unspecified  INTERVENTION:  - If Cortrak tube able to be advanced or Panda tube able to be placed by IR, recommend 30 mL Prostat x1/day with Vital High Protein @ 30 mL/hr to advance by 10 mL every 8 hours to reach goal rate of Vital High Protein @ 60 mL/hr. At goal rate, this regimen will provide 1540 kcal, 141 grams of protein, and 1204 mL free water. - Slow advancement given medical hx, difficulty passing tube, unsure of exact amount of intake in the past week, liquid diet only for ~8 weeks.  NUTRITION DIAGNOSIS:   Inadequate oral intake related to inability to eat as evidenced by NPO status.  GOAL:   Provide needs based on ASPEN/SCCM guidelines  MONITOR:   Vent status, Weight trends, Labs, Skin  REASON FOR ASSESSMENT:   Ventilator  ASSESSMENT:   71 y.o. male with history of chronic renal failure, rheumatoid arthritis, morbid obesity, achalasia, and atrial fibrillation. Presenting with altered mental status. Currently residing at a local skilled nursing facility. Patient was in his normal state of health approximately 2 days PTA. Over the weekend experienced progressive decline in mental status with noted delusions. Episodes of hypoxia requiring supplemental oxygen were also noted. Patient reportedly hospitalized in October for food impaction despite previous Botox administration for achalasia. Patient was transitioned to rehabilitation last Wednesday (11/7). On Friday evening his wife noticed that he was more confused. She reports decreased oral intake.  BMI indicates obesity/borderline morbid obesity. Pt was intubated in the ED. OGT was attempted several times by nursing staff following intubation. Spoke with RN this AM who states that OGT went into the lung or coiled in the back of the mouth with each attempt. This RD attempted to place Cortrak tube. Unable to pass Cortrak in L nare past 20 cm, unable to pass in R nare  past 48 cm. Left Cortrak in place in R nare and secured with bridle device. RN called IR who is going to assess if they will be able to advance Cortrak tube and then have this RD re-attach bridle.   Spoke with wife at bedside. She reports that pt was given double the usual dose of Botox but that it did not aid in any way with achalasia. She states that since mid-September pt has been permitted to only have full liquids and clear liquids. She mainly makes soups and puts them in [employer] in order to liquify everything. He was also consuming milkshakes and/or ice cream daily. She states that within the past few days especially volume consumed has been far less than when pt was initially placed on this diet. She states he barely consumed anything the past few days. She reports that pt was drinking Ensure Plus 1-2x daily although intake of this was decreased the past few days also.   Unable to obtain weight hx at time of discussion; will ask about this at follow-up.   Patient is currently intubated on ventilator support MV: 12.3 L/min Temp (24hrs), Avg:98.5 F (36.9 C), Min:98 F (36.7 C), Max:99.1 F (37.3 C) Propofol: none BP: 105/71 and MAP: 83  Medications reviewed; 20 mg IV Pepcid/day, 80 mg IV Lasix x1 dose yesterday, 50 mg Solu-cortef QID, sliding scale Novolog, 40 mg IV Protonix BID. Labs reviewed; CBGs: 165, 122, and 108 mg/dL this AM, BUN: 70 mg/dL, creatinine: 6.96 mg/dL, Ca: 8.8 mg/dL, GFR: 16 mL/min.       NUTRITION - FOCUSED PHYSICAL EXAM: No fat or  muscle wasting noted, no edema. No signs of micronutrient deficiency noted at this time.    Diet Order:  Diet NPO time specified  EDUCATION NEEDS:   No education needs have been identified at this time  Skin:  Skin Assessment: Skin Integrity Issues: Skin Integrity Issues:: Stage II, DTI DTI: Bilateral buttocks Stage II: Sacrum  Last BM:  11/10  Height:   Ht Readings from Last 1 Encounters:  03/21/2017 5\' 9"  (1.753 m)     Weight:   Wt Readings from Last 1 Encounters:  03/25/17 266 lb 12.1 oz (121 kg)    Ideal Body Weight:  72.73 kg  BMI:  Body mass index is 39.39 kg/m.  Estimated Nutritional Needs:   Kcal:  13/12/18 (11-14 kcal/kg)  Protein:  145 grams (2 grams/kg IBW)  Fluid:  >/= 1.7 L/day     [id number], MS, RD, LDN, [facility] Inpatient Clinical Dietitian Pager # [phone] After hours/weekend pager # [phone]

## 2017-03-25 NOTE — Consult Note (Signed)
`Requesting Physician: Dr. Milinda Hirschfeld    Chief Complaint: strke  History obtained from:   Chart    HPI:                                                                                                                                         48 history obtained from the chart as patient is intubated.   Earl Barber is an 71 y.o. male with "history of chronic renal failure, rheumatoid arthritis, morbid obesity, and atrial fibrillation. Presenting with altered mental status. Currently residing at a local skilled nursing facility. Patient was in his normal state of health approximately 2 days ago oriented and conversational. Over the weekend experienced progressive decline in mental status with noted delusions. Episodes of hypoxia requiring supplemental oxygen were also noted. In the setting of his continued altered and worsening mentation he was endotracheally intubated. Initially the patient was administered Lasix thinking this could represent a congestive heart failure exacerbation but with the higher probability of healthcare associated pneumonia sepsis protocol was initiated. Patient reportedly hospitalized in October for food impaction despite previous Botox administration for achalasia. Patient was transitioned to rehabilitation last Wednesday. On Friday evening his wife noticed that he was more confused. She reports decreased intake of his usual sustenance: Soups. She denies any aspiration events. Staff did note cough productive of a yellow mucus. He did previously have his influenza vaccine. He is maintained on prednisone 5 mg daily in addition to his monoclonal antibody for treatment of his underlying rheumatoid arthritis. The patient is also on systemic anticoagulation for his atrial fibrillation.  Head CT was obtained secondary to altered mental status.  This resulted in moderate region of low attenuation in the inferior aspect of the right cerebellar hemisphere likely acute to subacute infarct.   For this reason neurology was consulted    Date last known well: Unable to determine Time last known well: Unable to determine tPA Given: No: out of window   Modified Rankin: Rankin Score=3   Past Medical History:  Diagnosis Date  . Achalasia   . Atrial fibrillation ([place])   . CHF (congestive heart failure) ([place])   . Chronic renal failure   . Food impaction of esophagus   . Rheumatoid arthritis [facility])     Past Surgical History:  Procedure Laterality Date  . [place]  . ESOPHAGOGASTRODUODENOSCOPY     repeatedly for food impactions  . ESOPHAGOSCOPY W/ BOTOX INJECTION  01/03/2017    Family History  Problem Relation Age of Onset  . Rheum arthritis Mother   . Congestive Heart Failure Mother   . Rheum arthritis Brother   . Atrial fibrillation Brother    Social History:  reports that he quit smoking about 35 years ago. He started smoking about 67 years ago. He has a 56.00 pack-year smoking history. he has never used smokeless tobacco. He reports that he does  not drink alcohol or use drugs.  Allergies:  Allergies  Allergen Reactions  . Bupropion Other (See Comments)    unspecified  . Pilocarpine Other (See Comments)    unspecified  . Prevacid [Lansoprazole] Other (See Comments)    unspecified  . Sulfasalazine Other (See Comments)    unspecified  . Sulfamethoxazole Rash    Medications:                                                                                                                           Scheduled: . chlorhexidine gluconate (MEDLINE KIT)  15 mL Mouth Rinse BID  . heparin  5,000 Units Subcutaneous Q8H  . hydrocortisone sod succinate (SOLU-CORTEF) inj  50 mg Intravenous Q6H  . insulin aspart  0-9 Units Subcutaneous Q4H  . mouth rinse  15 mL Mouth Rinse QID  . pantoprazole (PROTONIX) IV  40 mg Intravenous Q12H   Continuous: . sodium chloride 250 mL (03/25/17 0700)  . [START ON 03/26/2017] famotidine (PEPCID) IV    .  norepinephrine (LEVOPHED) Adult infusion Stopped (03/25/17 0804)  . piperacillin-tazobactam (ZOSYN)  IV Stopped (03/25/17 1033)  . [START ON 03/26/2017] vancomycin     FUX:NATFTD chloride, fentaNYL (SUBLIMAZE) injection, midazolam  ROS:                                                                                                                                       History obtained from intubated    Neurologic Examination:                                                                                                      Blood pressure 104/84, pulse 93, temperature 98.4 F (36.9 C), temperature source Oral, resp. rate (!) 22, height 5' 9"  (1.753 m), weight 121 kg (266 lb 12.1 oz), SpO2 94 %.  HEENT-  Normocephalic, no lesions, without obvious abnormality.  Normal external eye and conjunctiva.  Normal TM's bilaterally.  Normal auditory  canals and external ears. Normal external nose, mucus membranes and septum.  Normal pharynx. Cardiovascular- irregularly irregular rhythm, pulses palpable throughout   Lungs- chest clear, no wheezing, rales, normal symmetric air entry Abdomen- normal findings: bowel sounds normal Extremities- no edema Lymph-no adenopathy palpable Musculoskeletal-no joint tenderness, deformity or swelling Skin-warm and dry, no hyperpigmentation, vitiligo, or suspicious lesions  Neurological Examination Mental Status: Alert, intubated and only able to follow simple commands. Even then at time he seems to have a hard time figuring out if I want him to push or pull.   Cranial Nerves: II:  Visual fields grossly normal III,IV, VI: ptosis not present, extra-ocular motions intact bilaterally, pupils equal, round, reactive to light and accommodation V,VII: smile appears symmetric butintubated, facial light touch sensation normal bilaterally VIII: hearing normal bilaterally XI: bilateral shoulder shrug  Motor: Moving all extremities antigravity but right > left and more  spontaneous on the right Sensory: Pinprick and light touch intact throughout, bilaterally Deep Tendon Reflexes: 2+ and symmetric throughout Plantars: Right: downgoing   Left: downgoing Cerebellar: Difficult to assess at this time. All of his movements ar jerky and not fluent.  Gait: not teated       Lab Results: Basic Metabolic Panel: Recent Labs  Lab 04/06/2017 1305 04/09/2017 2041 03/25/17 0607  NA 132* 134* 135  K 5.5* 5.0 5.0  CL 97* 100* 101  CO2 24 22 21*  GLUCOSE 128* 185* 139*  BUN 76* 71* 70*  CREATININE 4.24* 3.89* 3.52*  CALCIUM 9.2 8.7* 8.8*  MG  --  2.2 2.1  PHOS  --  5.0* 4.4    Liver Function Tests: Recent Labs  Lab 04/03/2017 1305  AST 29  ALT 29  ALKPHOS 138*  BILITOT 1.2  PROT 6.5  ALBUMIN 2.8*   No results for input(s): LIPASE, AMYLASE in the last 168 hours. No results for input(s): AMMONIA in the last 168 hours.  CBC: Recent Labs  Lab 03/26/2017 1305 03/25/17 0607  WBC 16.5* 16.2*  NEUTROABS 13.8*  --   HGB 10.3* 10.3*  HCT 32.0* 32.0*  MCV 104.6* 104.2*  PLT 362 372    Cardiac Enzymes: Recent Labs  Lab 03/14/2017 2041 03/25/17 0139 03/25/17 0607  TROPONINI <0.03 <0.03 <0.03    Lipid Panel: No results for input(s): CHOL, TRIG, HDL, CHOLHDL, VLDL, LDLCALC in the last 168 hours.  CBG: Recent Labs  Lab 04/08/2017 1301 03/26/2017 2348 03/25/17 0413 03/25/17 0722 03/25/17 1135  GLUCAP 110* 171* 165* 122* 108*    Microbiology: Results for orders placed or performed during the hospital encounter of 04/04/2017  Urine culture     Status: None   Collection Time: 04/01/2017  1:41 PM  Result Value Ref Range Status   Specimen Description URINE, CATHETERIZED  Final   Special Requests NONE  Final   Culture NO GROWTH  Final   Report Status 03/25/2017 FINAL  Final  Respiratory Panel by PCR     Status: None   Collection Time: 04/06/2017  9:10 PM  Result Value Ref Range Status   Adenovirus NOT DETECTED NOT DETECTED Final   Coronavirus 229E  NOT DETECTED NOT DETECTED Final   Coronavirus HKU1 NOT DETECTED NOT DETECTED Final   Coronavirus NL63 NOT DETECTED NOT DETECTED Final   Coronavirus OC43 NOT DETECTED NOT DETECTED Final   Metapneumovirus NOT DETECTED NOT DETECTED Final   Rhinovirus / Enterovirus NOT DETECTED NOT DETECTED Final   Influenza A NOT DETECTED NOT DETECTED Final   Influenza B NOT DETECTED NOT  DETECTED Final   Parainfluenza Virus 1 NOT DETECTED NOT DETECTED Final   Parainfluenza Virus 2 NOT DETECTED NOT DETECTED Final   Parainfluenza Virus 3 NOT DETECTED NOT DETECTED Final   Parainfluenza Virus 4 NOT DETECTED NOT DETECTED Final   Respiratory Syncytial Virus NOT DETECTED NOT DETECTED Final   Bordetella pertussis NOT DETECTED NOT DETECTED Final   Chlamydophila pneumoniae NOT DETECTED NOT DETECTED Final   Mycoplasma pneumoniae NOT DETECTED NOT DETECTED Final  MRSA PCR Screening     Status: None   Collection Time: 04/01/2017  9:10 PM  Result Value Ref Range Status   MRSA by PCR NEGATIVE NEGATIVE Final    Comment:        The GeneXpert MRSA Assay (FDA approved for NASAL specimens only), is one component of a comprehensive MRSA colonization surveillance program. It is not intended to diagnose MRSA infection nor to guide or monitor treatment for MRSA infections.   Culture, respiratory (tracheal aspirate)     Status: None (Preliminary result)   Collection Time: 03/25/17  3:48 AM  Result Value Ref Range Status   Specimen Description TRACHEAL ASPIRATE  Final   Special Requests NONE  Final   Gram Stain   Final    ABUNDANT WBC PRESENT, PREDOMINANTLY PMN ABUNDANT GRAM POSITIVE COCCI    Culture PENDING  Incomplete   Report Status PENDING  Incomplete    Coagulation Studies: Recent Labs    04/04/2017 04-Aug-2039  LABPROT 28.1*  INR 2.66    Imaging: Ct Head Wo Contrast  Result Date: 03/18/2017 CLINICAL DATA:  Altered mental status 2 days. EXAM: CT HEAD WITHOUT CONTRAST TECHNIQUE: Contiguous axial images were  obtained from the base of the skull through the vertex without intravenous contrast. COMPARISON:  None. FINDINGS: Brain: The ventricles, cisterns and other CSF spaces are within normal. There is minimal chronic ischemic microvascular disease. There is no mass, mass effect or shift midline structures. No evidence of acute hemorrhage. Moderate region of low-attenuation over the inferior right cerebellum with possible minimal local mass effect likely acute to subacute ischemic change, although chronic ischemic change is possible. Vascular: No hyperdense vessel or unexpected calcification. Skull: Normal. Negative for fracture or focal lesion. Sinuses/Orbits: No acute finding. Other: None. IMPRESSION: Moderate region of low-attenuation of the inferior aspect of the right cerebellar hemisphere likely acute to subacute infarction, although chronic ischemic changes possible. Consider MRI for further evaluation. Minimal chronic ischemic microvascular disease. Electronically Signed   By: Marin Olp M.D.   On: 03/18/2017 15:53   Dg Chest Port 1 View  Result Date: 03/25/2017 CLINICAL DATA:  Assess endotracheal tube. Acute hypoxic respiratory failure. EXAM: PORTABLE CHEST 1 VIEW COMPARISON:  One-view chest x-ray 04/05/2017. FINDINGS: The heart is enlarged. Mild edema is increasing. Bibasilar airspace disease is present. Endotracheal tube is stable, 5.5 cm above the carina. Bilateral pleural effusions are present. Lung volumes are low. IMPRESSION: 1. Stable endotracheal tube. 2. Stable cardiomegaly with increasing bilateral edema, left greater than right pleural effusions, and bibasilar airspace disease. This likely represents atelectasis and congestive heart failure. Infection is considered less likely. Electronically Signed   By: San Morelle M.D.   On: 03/25/2017 07:11   Dg Chest Portable 1 View  Result Date: 03/22/2017 CLINICAL DATA:  Intubated EXAM: PORTABLE CHEST 1 VIEW COMPARISON:  Chest radiograph  from earlier today. FINDINGS: Endotracheal tube tip is 4.5 cm above the carina. Low lung volumes. Stable cardiomediastinal silhouette with top-normal heart size and aortic atherosclerosis. No pneumothorax. No pleural effusion. Linear  and hazy parahilar and bibasilar lung opacities, stable. IMPRESSION: 1. Well-positioned endotracheal tube. 2. Low lung volumes. 3. Stable mild cardiomegaly. 4. Stable linear and hazy parahilar and bibasilar lung opacities, favor mild pulmonary edema and atelectasis. Electronically Signed   By: Ilona Sorrel M.D.   On: 03/29/2017 16:31   Dg Chest Port 1 View  Result Date: 03/31/2017 CLINICAL DATA:  Altered mental status 2 days. EXAM: PORTABLE CHEST 1 VIEW COMPARISON:  None. FINDINGS: Lungs are somewhat hypoinflated with mild perihilar bibasilar opacification likely interstitial edema. Infection in the lung bases is less likely. No definite effusion. Cardiomediastinal silhouette is within normal. There is calcified plaque over the thoracic aorta. There is mild degenerate change of the spine. IMPRESSION: Mild perihilar and bibasilar opacification likely interstitial edema. Bibasilar infection is less likely. Electronically Signed   By: Marin Olp M.D.   On: 04/03/2017 13:50       Assessment and plan discussed with with attending physician and they are in agreement.    Etta Quill PA-C [facility] [phone]  03/25/2017, 12:40 PM   Assessment: 71 y.o. male with acute/subacute right cerebellar CVA.  Though cerebellar strokes can play a role in a multifactorial mental status change, I do wonder if there is actually more that we are not seeing well on CT.  MRI would give a more clear assessment.  Stroke Risk Factors - atrial fibrillation   Recommend 1. HgbA1c, fasting lipid panel 2. MRI of the brain without contrast--if unable would obtain CT head in AM 3. PT consult, OT consult, Speech consult 4. Echocardiogram 5. 80 mg of Atorvistatin 6.  Prophylactic therapy-already on edoxaban 7. Risk factor modification 8. Telemetry monitoring 9. Frequent neuro checks 10 NPO until passes stroke swallow screen 11 please page stroke NP  Or  PA  Or MD from 8am -4 pm  as this patient from this time will be  followed by the stroke.   You can look them up on [email or web address]  Password [email or web address]   Roland Rack, MD [facility] [phone]  If 7pm- 7am, please page neurology on call as listed in [place].

## 2017-03-25 NOTE — Consult Note (Addendum)
WOC Nurse wound consult note Reason for Consult: Consult requested for sacrum and buttocks.  Family member at the bedside to assess wound appearance and discuss plan of care. Wound type: Sacrum with stage 2 pressure injury; .2X.2X.1cm, pink and moist.  Surrounded by dark reddish-purple deep tissue injury to bilat buttocks; affected area is approx 7X5cm.  Skin folds to chest and abd red and moist with intertrigo. Pressure Injury POA: Yes Dressing procedure/placement/frequency: Pt is critically ill with multiple systemic factors which can impair healing. He is on a Sport low-air loss bed to reduce pressure.  Foam dressing in place to protect location from further injury.  Discussed with family member that deep tissue pressure injuries are high risk to evolve into full thickness tissue loss within 7 days.  Please re-consult if further assistance is needed.  Thank-you,  Cammie Mcgee MSN, RN, CWOCN, [place], CNS [phone]

## 2017-03-25 NOTE — Procedures (Signed)
Arterial Catheter Insertion Procedure Note Earl Barber [id number] [birth year]  Procedure: Insertion of Arterial Catheter  Indications: Blood pressure monitoring  Procedure Details Consent: Risks of procedure as well as the alternatives and risks of each were explained to the (patient/caregiver).  Consent for procedure obtained. Time Out: Verified patient identification, verified procedure, site/side was marked, verified correct patient position, special equipment/implants available, medications/allergies/relevent history reviewed, required imaging and test results available.  Performed  Maximum sterile technique was used including antiseptics, cap, gloves, gown, hand hygiene, mask and sheet. Skin prep: Chlorhexidine; local anesthetic administered 20 gauge catheter was inserted into right radial artery using the Seldinger technique.  Evaluation Blood flow good; BP tracing good. Complications: No apparent complications. Positive allen test with arterial pressure of 126/60  Leafy Half 03/25/2017

## 2017-03-25 NOTE — Progress Notes (Signed)
eLink Physician-Brief Progress Note Patient Name: Earl Barber DOB: Sep 27, 1945 MRN: [id number]   Date of Service  03/25/2017  HPI/Events of Note  ABG on 50%/PRVC 16/TV 570/P 5 = 7.27/46.5/70.4  eICU Interventions  Will order: 1. Increase PRVC rate to 22 and PEEP to 8. 2. ABG at 6 AM.     Intervention Category Major Interventions: Acid-Base disturbance - evaluation and management;Respiratory failure - evaluation and management  Earl Barber [name] 03/25/2017, 4:25 AM

## 2017-03-25 NOTE — Progress Notes (Signed)
Pharmacy Antibiotic Note  Earl Barber is a 71 y.o. male admitted on 03-26-2017 with pneumonia.  Pharmacy has been consulted for vancomycin and zosyn dosing.  Patient presenting with acute hypoxic respiratory failure and altered mental status. Has hx of CKD, RA (on chronic steroids), and currently is residing at [facility]. Received loading dose of vancomycin and dose of zosyn in ED. WBC 16.5>16.2 trending down, afebrile and renal function improving (SCr 3.89>3.52; CrCl ~24).  Plan: Vancomycin 1000 mg IV every 48 hours.  Goal trough 15-20 mcg/mL. Zosyn 3.375g IV every 8 hours  adjusted from 2.25g IV q6h Monitor renal function, cx results, clinical status, and VT as needed  Height: 5\' 9"  (175.3 cm) Weight: 266 lb 12.1 oz (121 kg) IBW/kg (Calculated) : 70.7  Temp (24hrs), Avg:98.3 F (36.8 C), Min:98 F (36.7 C), Max:98.5 F (36.9 C)  Recent Labs  Lab 03-26-2017 1305 2017-03-26 1321 03-26-17 1456 03/26/2017 2041 03/25/17 0138 03/25/17 0607  WBC 16.5*  --   --   --   --  16.2*  CREATININE 4.24*  --   --  3.89*  --  3.52*  LATICACIDVEN  --  1.01 0.76 1.3 1.1 1.5    Estimated Creatinine Clearance: 24 mL/min (A) (by C-G formula based on SCr of 3.52 mg/dL (H)).    Allergies  Allergen Reactions  . Bupropion Other (See Comments)    unspecified  . Pilocarpine Other (See Comments)    unspecified  . Prevacid [Lansoprazole] Other (See Comments)    unspecified  . Sulfasalazine Other (See Comments)    unspecified  . Sulfamethoxazole Rash    Antimicrobials this admission: Zosyn 11/11 >>  Vancomycin 11/11 >>   Dose adjustments this admission: N/A  Microbiology results: 11/11 BCx: sent 11/11 UCx: ngtd 11/11 Sputum: ordered 11/11 Viral: negative 11/11 MRSA PCR: negative  Thank you for allowing pharmacy to be a part of this patient's care.  13/11, PharmD PGY1 Acute Care Pharmacy Resident Pager: [phone] 03/25/2017 1:58 PM

## 2017-03-26 ENCOUNTER — Inpatient Hospital Stay (HOSPITAL_COMMUNITY): Payer: Medicare HMO

## 2017-03-26 DIAGNOSIS — I639 Cerebral infarction, unspecified: Secondary | ICD-10-CM

## 2017-03-26 DIAGNOSIS — J9601 Acute respiratory failure with hypoxia: Secondary | ICD-10-CM

## 2017-03-26 DIAGNOSIS — Z452 Encounter for adjustment and management of vascular access device: Secondary | ICD-10-CM

## 2017-03-26 LAB — LEGIONELLA PNEUMOPHILA SEROGP 1 UR AG: L. PNEUMOPHILA SEROGP 1 UR AG: NEGATIVE

## 2017-03-26 LAB — GLUCOSE, CAPILLARY
GLUCOSE-CAPILLARY: 107 mg/dL — AB (ref 65–99)
GLUCOSE-CAPILLARY: 103 mg/dL — AB (ref 65–99)
GLUCOSE-CAPILLARY: 136 mg/dL — AB (ref 65–99)
Glucose-Capillary: 116 mg/dL — ABNORMAL HIGH (ref 65–99)
Glucose-Capillary: 105 mg/dL — ABNORMAL HIGH (ref 65–99)
Glucose-Capillary: 121 mg/dL — ABNORMAL HIGH (ref 65–99)

## 2017-03-26 LAB — HEMOGLOBIN A1C
Hgb A1c MFr Bld: 5.6 % (ref 4.8–5.6)
MEAN PLASMA GLUCOSE: 114.02 mg/dL

## 2017-03-26 LAB — COMPREHENSIVE METABOLIC PANEL
ALBUMIN: 2.2 g/dL — AB (ref 3.5–5.0)
ALK PHOS: 99 U/L (ref 38–126)
ALT: 21 U/L (ref 17–63)
AST: 18 U/L (ref 15–41)
Anion gap: 10 (ref 5–15)
BILIRUBIN TOTAL: 0.9 mg/dL (ref 0.3–1.2)
BUN: 67 mg/dL — AB (ref 6–20)
CO2: 22 mmol/L (ref 22–32)
Calcium: 9.3 mg/dL (ref 8.9–10.3)
Chloride: 107 mmol/L (ref 101–111)
Creatinine, Ser: 2.95 mg/dL — ABNORMAL HIGH (ref 0.61–1.24)
GFR calc Af Amer: 23 mL/min — ABNORMAL LOW (ref >60)
GFR calc non Af Amer: 20 mL/min — ABNORMAL LOW (ref >60)
GLUCOSE: 121 mg/dL — AB (ref 65–99)
POTASSIUM: 3.6 mmol/L (ref 3.5–5.1)
SODIUM: 139 mmol/L (ref 135–145)
TOTAL PROTEIN: 6.5 g/dL (ref 6.5–8.1)

## 2017-03-26 LAB — CBC
HEMATOCRIT: 29.3 % — AB (ref 39.0–52.0)
Hemoglobin: 9.4 g/dL — ABNORMAL LOW (ref 13.0–17.0)
MCH: 33.1 pg (ref 26.0–34.0)
MCHC: 32.1 g/dL (ref 30.0–36.0)
MCV: 103.2 fL — AB (ref 78.0–100.0)
Platelets: 467 10*3/uL — ABNORMAL HIGH (ref 150–400)
RBC: 2.84 MIL/uL — AB (ref 4.22–5.81)
RDW: 15 % (ref 11.5–15.5)
WBC: 21.8 10*3/uL — ABNORMAL HIGH (ref 4.0–10.5)

## 2017-03-26 LAB — LIPID PANEL
CHOLESTEROL: 92 mg/dL (ref 0–200)
HDL: 29 mg/dL — ABNORMAL LOW (ref >40)
LDL Cholesterol: 42 mg/dL (ref 0–99)
TRIGLYCERIDES: 103 mg/dL (ref <150)
Total CHOL/HDL Ratio: 3.2 RATIO
VLDL: 21 mg/dL (ref 0–40)

## 2017-03-26 LAB — PROCALCITONIN: Procalcitonin: 0.58 ng/mL

## 2017-03-26 MED ORDER — SODIUM CHLORIDE 0.9% FLUSH
10.0000 mL | INTRAVENOUS | Status: DC | PRN
  Administered 2017-03-26: 10 mL
  Filled 2017-03-26: qty 40

## 2017-03-26 MED ORDER — VANCOMYCIN HCL 10 G IV SOLR
1500.0000 mg | INTRAVENOUS | Status: DC
  Filled 2017-03-26: qty 1500

## 2017-03-26 MED ORDER — VANCOMYCIN HCL 10 G IV SOLR
1500.0000 mg | INTRAVENOUS | Status: DC
  Administered 2017-03-26: 1500 mg via INTRAVENOUS
  Filled 2017-03-26: qty 1500

## 2017-03-26 MED ORDER — LIDOCAINE VISCOUS 2 % MT SOLN
15.0000 mL | Freq: Once | OROMUCOSAL | Status: AC
  Administered 2017-03-26: 3 mL via OROMUCOSAL

## 2017-03-26 MED ORDER — ASPIRIN 81 MG PO CHEW
81.0000 mg | CHEWABLE_TABLET | Freq: Every day | ORAL | Status: DC
  Filled 2017-03-26 (×2): qty 1

## 2017-03-26 MED ORDER — IOPAMIDOL (ISOVUE-300) INJECTION 61%
50.0000 mL | Freq: Once | INTRAVENOUS | Status: AC | PRN
  Administered 2017-03-26: 1 mL

## 2017-03-26 MED ORDER — SODIUM CHLORIDE 0.9% FLUSH
10.0000 mL | Freq: Two times a day (BID) | INTRAVENOUS | Status: DC
  Administered 2017-03-26 – 2017-03-28 (×4): 10 mL

## 2017-03-26 MED ORDER — CHLORHEXIDINE GLUCONATE CLOTH 2 % EX PADS
6.0000 | MEDICATED_PAD | Freq: Every day | CUTANEOUS | Status: DC
  Administered 2017-03-26 – 2017-03-28 (×3): 6 via TOPICAL

## 2017-03-26 NOTE — Progress Notes (Addendum)
Pharmacy Antibiotic Note  Earl Barber is a 71 y.o. male admitted on 03/19/2017 with pneumonia.  Pharmacy has been consulted for vancomycin and zosyn dosing.  Patient presenting with acute hypoxic respiratory failure and altered mental status. Has hx of CKD, RA (on chronic steroids), and currently is residing at [facility]. Received loading dose of vancomycin and dose of zosyn in ED. WBC 16.2>21.8 trending up, afebrile and renal function improving (SCr 3.52>2.95; CrCl ~22).   Plan: Vancomycin 1500mg  IV every 48 hours.  Goal trough 15-20 mcg/mL. Zosyn 3.375g IV every 8 hours  Monitor clinic progress, WBC, TMax, renal function, electrolytes F/u BCx, C/S, future de-escalation, VT as needed & length of therapy  Height: 5\' 9"  (175.3 cm) Weight: 263 lb 14.3 oz (119.7 kg) IBW/kg (Calculated) : 70.7  Temp (24hrs), Avg:98.7 F (37.1 C), Min:97.4 F (36.3 C), Max:100.4 F (38 C)  Recent Labs  Lab 04/05/2017 1305 04/10/2017 1321 03/15/2017 1456 04/06/2017 2041 03/25/17 0138 03/25/17 0607 03/26/17 0619  WBC 16.5*  --   --   --   --  16.2* 21.8*  CREATININE 4.24*  --   --  3.89*  --  3.52* 2.95*  LATICACIDVEN  --  1.01 0.76 1.3 1.1 1.5  --     Estimated Creatinine Clearance: 28.5 mL/min (A) (by C-G formula based on SCr of 2.95 mg/dL (H)).    Allergies  Allergen Reactions  . Bupropion Other (See Comments)    unspecified  . Pilocarpine Other (See Comments)    unspecified  . Prevacid [Lansoprazole] Other (See Comments)    unspecified  . Sulfasalazine Other (See Comments)    unspecified  . Sulfamethoxazole Rash    Antimicrobials this admission: Zosyn 11/11 >>  Vancomycin 11/11 >>   Dose adjustments this admission: Due to improved renal function and weight gain: 11/12: increased Zosyn to 3.375g q8h from 2.25g q6h 11/13: increased Vancomycin to 1500mg  q48h from 1000mg  q48h  Microbiology results: 11/11 BCx: ngtd 11/11 UCx: ngtd 11/11 Sputum: ordered 11/11 Viral: negative 11/11 MRSA PCR:  negative  Thank you for allowing pharmacy to be a part of this patient's care.  13/11, PharmD PGY1 Acute Care Pharmacy Resident Pager: [phone] 03/26/2017 9:15 AM

## 2017-03-26 NOTE — Progress Notes (Signed)
Limited Transcranial Doppler Completed due to poor acoustic windows.  Marilynne Halsted, BS, RDMS, RVT

## 2017-03-26 NOTE — Progress Notes (Signed)
eLink Physician-Brief Progress Note Patient Name: Earl Barber DOB: 23-May-1945 MRN: [id number]   Date of Service  03/26/2017  HPI/Events of Note  Called to review CXR for L IJ CVL placement. L IJ CVL tip in innomiate vein. No pneumothorax.   eICU Interventions  OK to use L IJ CVL line.      Intervention Category Intermediate Interventions: Diagnostic test evaluation  Omni Dunsworth Dennard Nip 03/26/2017, 2:32 AM

## 2017-03-26 NOTE — Procedures (Signed)
Central Venous Catheter Insertion Procedure Note Nathanial Arrighi [id number] [birth year]  Procedure: Insertion of Central Venous Catheter Indications: Assessment of intravascular volume, Drug and/or fluid administration and Frequent blood sampling  Procedure Details Consent: Unable to obtain consent because of altered level of consciousness. Time Out: Verified patient identification, verified procedure, site/side was marked, verified correct patient position, special equipment/implants available, medications/allergies/relevent history reviewed, required imaging and test results available.  Performed  Maximum sterile technique was used including antiseptics, cap, gloves, gown, hand hygiene, mask and sheet. Skin prep: Chlorhexidine; local anesthetic administered A antimicrobial bonded/coated triple lumen catheter was placed in the left internal jugular vein using the Seldinger technique.  Evaluation Blood flow good Complications: No apparent complications Patient did tolerate procedure well. Chest X-ray ordered to verify placement.  CXR: pending.  Procedure performed under direct ultrasound guidance for real time vessel cannulation.      Rutherford Guys, [place] [facility] [place] [facility] Pager: [phone]  or [phone] 03/26/2017, 2:02 AM

## 2017-03-26 NOTE — Progress Notes (Signed)
Preliminary results by tech - Carotid Duplex Completed. No evidence of a significant stenosis in bilateral carotid arteries. Vertebral arteries are patent with antegrade flow. Reshanda Lewey, BS, RDMS, RVT  

## 2017-03-26 NOTE — Progress Notes (Signed)
STROKE TEAM PROGRESS NOTE   SUBJECTIVE (INTERVAL HISTORY) His  wife is at the bedside.  Patient is found laying in bed in NAD, intubated and restraints x 2. Patient opens eyes to voice and will briefly follow simple commands. MAE's. POC discussed with wife.  OBJECTIVE Recent Labs  Lab 03/25/17 1939 03/25/17 2322 03/26/17 0331 03/26/17 0714 03/26/17 1118  GLUCAP 112* 138* 116* 107* 103*   Recent Labs  Lab 03/26/2017 1305 03/27/2017 2041 03/25/17 0607 03/26/17 0619  NA 132* 134* 135 139  K 5.5* 5.0 5.0 3.6  CL 97* 100* 101 107  CO2 24 22 21* 22  GLUCOSE 128* 185* 139* 121*  BUN 76* 71* 70* 67*  CREATININE 4.24* 3.89* 3.52* 2.95*  CALCIUM 9.2 8.7* 8.8* 9.3  MG  --  2.2 2.1  --   PHOS  --  5.0* 4.4  --    Recent Labs  Lab 03/31/2017 1305 03/26/17 0619  AST 29 18  ALT 29 21  ALKPHOS 138* 99  BILITOT 1.2 0.9  PROT 6.5 6.5  ALBUMIN 2.8* 2.2*   Recent Labs  Lab 03/29/2017 1305 03/25/17 0607 03/26/17 0619  WBC 16.5* 16.2* 21.8*  NEUTROABS 13.8*  --   --   HGB 10.3* 10.3* 9.4*  HCT 32.0* 32.0* 29.3*  MCV 104.6* 104.2* 103.2*  PLT 362 372 467*   Recent Labs  Lab 03/19/2017 2041 03/25/17 0139 03/25/17 0607  TROPONINI <0.03 <0.03 <0.03   Recent Labs    03/22/2017 2041  LABPROT 28.1*  INR 2.66   Recent Labs    04/02/2017 1341  COLORURINE AMBER*  LABSPEC 1.014  PHURINE 5.0  GLUCOSEU NEGATIVE  HGBUR NEGATIVE  BILIRUBINUR NEGATIVE  KETONESUR NEGATIVE  PROTEINUR NEGATIVE  NITRITE NEGATIVE  LEUKOCYTESUR NEGATIVE       Component Value Date/Time   CHOL 92 03/26/2017 0900   TRIG 103 03/26/2017 0900   HDL 29 (L) 03/26/2017 0900   CHOLHDL 3.2 03/26/2017 0900   VLDL 21 03/26/2017 0900   LDLCALC 42 03/26/2017 0900   Lab Results  Component Value Date   HGBA1C 5.6 03/26/2017   No results found for: LABOPIA, COCAINSCRNUR, LABBENZ, AMPHETMU, THCU, LABBARB  No results for input(s): ETH in the last 168 hours.  IMAGING: I have personally reviewed the radiological  images below and agree with the radiology interpretations.  Ct Head Wo Contrast Result Date: 03/26/2017 IMPRESSION: 1. Evolving acute RIGHT cerebellar/posterior-inferior cerebellar artery territory infarct without hemorrhagic conversion. 2. Regional mass effect, no hydrocephalus.   Ct Head Wo Contrast Result Date: 04/11/2017 IMPRESSION: Moderate region of low-attenuation of the inferior aspect of the right cerebellar hemisphere likely acute to subacute infarction, although chronic ischemic changes possible. Consider MRI for further evaluation. Minimal chronic ischemic microvascular disease.   Mr Brain Wo Contrast Result Date: 03/26/2017 IMPRESSION: 1. Acute RIGHT cerebellar infarct with trace petechial hemorrhage. Regional mass effect, no hydrocephalus. 2. Tiny old frontoparietal cortical infarcts. Old small bilateral cerebellar infarcts. 3. Mild chronic small vessel ischemic disease.   ECHO: Study Conclusions - Left ventricle: Assessment of EF and wall motion very limited by   marked septal-lateral wall dyssynchrony from conduction   abnormality. The LVF appears mildly reduced with EF estimated at   40%. The cavity size was mildly dilated. There was mild   concentric hypertrophy. The study was not technically sufficient   to allow evaluation of LV diastolic dysfunction due to atrial   fibrillation. - Ventricular septum: Septal motion showed severe paradox. These   changes are  consistent with intraventricular conduction delay. - Aorta: Aortic root dimension: 42 mm (ED). Ascending aorta   diameter: 45 mm (ED). - Aortic root: The aortic root was mildly dilated. - Ascending aorta: The ascending aorta was mildly dilated. - Atrial septum: There was increased thickness of the septum,   consistent with lipomatous hypertrophy. - Pulmonary arteries: Systolic pressure could not be accurately   estimated. Impressions: - The findings indicate significant septal-lateral left ventricular   wall  dyssynchrony.  Carotid Duplex - No evidence of a significant stenosis in bilateral carotid arteries. Vertebral arteries are patent with antegrade flow  Limited Transcranial Doppler - Completed due to poor acoustic windows normal mean flow velocities in bilateral ophthalmic, carotid and vertebral arteries.   PHYSICAL EXAM Temp:  [97.2 F (36.2 C)-100.4 F (38 C)] 97.2 F (36.2 C) (11/13 1118) Pulse Rate:  [9-121] 45 (11/13 1415) Resp:  [12-37] 20 (11/13 1415) BP: (70-122)/(39-110) 120/83 (11/13 0547) SpO2:  [88 %-100 %] 100 % (11/13 1415) Arterial Line BP: (85-165)/(41-81) 123/57 (11/13 1415) FiO2 (%):  [40 %-50 %] 40 % (11/13 1140) Weight:  [119.7 kg (263 lb 14.3 oz)] 119.7 kg (263 lb 14.3 oz) (11/13 0500)  General -Coby is elderly Caucasian male in no apparent distress, appears uncomfortable with ET tube, restrainted x 2 Respiratory - Intubated.  No wheezing. Cardiovascular - Iregular rate and rhythm   Neurological Examination Mental Status: Alert, intubated and only able to follow simple commands. Even then at time he seems to have a hard time figuring out if I want him to push or pull.   Cranial Nerves: II:  Visual fields appear grossly normal III,IV, VI: ptosis not present, extra-ocular motions appear intact bilaterally, pupils equal, round, reactive to light and accommodation V,VII: smile appears symmetric but intubated, facial light touch sensation normal bilaterally VIII: hearing normal bilaterally XI: bilateral shoulder shrug Motor: Moving all extremities antigravity but right > left and more spontaneous on the right Sensory:  light touch intact throughout, bilaterally Deep Tendon Reflexes: 2+ and symmetric throughout Plantars: Right: downgoing                                Left: downgoing Cerebellar: Difficult to assess at this time. All of his movements are jerky and not fluent.  Gait: deferred  ASSESSMENT AND PLAN: Mr. Earl Barber is a 71 y.o. male with PMH of  RA on chronic suppression with monoclonal antibody, AFIB, CHF, CKD, history of Achalasia with recent Food impaction of esophagus  admitted for AMS with hx of a hypoxic event and subsequent stroke seen on CT Head upon admission. Currently intubated.  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  CT Head - Acute/Subacute Right Cerebellar Infarct   2D Echo - ef 40 % with significant septal-lateral left ventricular wall dyssynchrony.   Carotid Duplex Completed. No evidence of a significant stenosis in bilateral carotid arteries. Vertebral arteries are patent with antegrade flow   Limited Transcranial Doppler Completed due to poor acoustic windows        Recommendations Will need discussion with family regarding goals of care   LDL - 42  HgbA1c 5.6  VTE prophylaxis -  Heparin   Diet - Diet NPO time specified   CVA  Meds-   aspirin 81 mg daily with Savaysa 60 mg daily  prior to admission,   now on ASA 81 mg daily  Ongoing aggressive stroke risk factor management  Therapy recommendations:  PENDING  Disposition: PENDING   AFIB, CHRONIC: Continue to HOLD Anticoagulant for now, was on Savaysa. Stopped sometime in September for Right knee swelling and bleeding Continue Heparin SQ INR - 2.6 on admission  SEPTIC SHOCK WITH FEVER Febrile at 100.75F at midnight IV Zosyn and Vanc in progress Cx pending  HYPOTENSION WITH HX OF HTN:  Hypotension 86/55 - noted overnight  Phenylephrine gtt initiated Permissive hypertension (OK if <220/120) for 24-48 hours post stroke and then gradually normalized within 5-7 days.  Long term BP goal normotensive.   ACUTE ENCEPHALOPATHY: Likely Multifactorial - will continue to monitor closely precedex gtt initiated and  PRN Versed.  ACHALASIA, CHRONIC: NPO for now - SLP may need FEES once patient extubated and more alert  HYPERLIPIDEMIA:  Home meds:  NONE  LDL     42 , goal < 70  No statin needed at discharge  R/O DIABETES:  HgbA1c   5.6 goal < 7.0  Controlled  Other Stroke Risk Factors:  Advanced age  Obesity, Body mass index is 38.97 kg/m.   CHF  Other Active Problems:  Acute hypoxic respiratory failure  Septic shock secondary to healthcare acquired pneumonia   Elevated troponin I: Suspected cardiac ischemia in the setting of shock and hypoxia  Acute on chronic renal failure: Suspected to be secondary to hypotension, improving  Hyperkalemia  Suspected adrenal insufficiency   Hospital day # 2  Brita Romp Stroke Neurology Team 03/26/2017 3:12 PM I have personally examined this patient, reviewed notes, independently viewed imaging studies, participated in medical decision making and plan of care.ROS completed by me personally and pertinent positives fully documented  I have made any additions or clarifications directly to the above note. Agree with note above. The patient presented with pneumonia, septic shock and respiratory failure and altered mental status: CT scan which shows an embolic right cerebellar infarct likely from his atrial fibrillation. The patient's prognosis is guarded given his multiple medical problems. Continue treatment for pneumonia and sepsis and ventilatory support. Will need to make decision about anticoagulation later only if patient survives and has reasonable quality of life. Long discussion with the patient's wife and answered questions. Discussed with Dr. Craige Cotta and critical care team.This patient is critically ill and at significant risk of neurological worsening, death and care requires constant monitoring of vital signs, hemodynamics,respiratory and cardiac monitoring, extensive review of multiple databases, frequent neurological assessment,  discussion with family, other specialists and medical decision making of high complexity.I have made any additions or clarifications directly to the above note.This critical care time does not reflect procedure time, or teaching time or supervisory time of PA/NP/Med Resident etc but could involve care discussion time.  I spent 30 minutes of neurocritical care time  in the care of  this patient.      Delia Heady, MD Medical Director [facility] Pager: [phone] 03/26/2017 4:26 PM  To contact Stroke Continuity provider, please refer to [email or web address]. After hours, contact General Neurology

## 2017-03-26 NOTE — Progress Notes (Signed)
Cortrak Tube Team Note:  Late entry; date and time of service was 11/12 at ~11:30 AM.  Consult received to place a Cortrak feeding tube.   A 10 F Cortrak tube was placed in the R nare and secured with a nasal bridle at 45 cm. Per the Cortrak monitor reading the tube tip is in the esophagus, not appropriate for use. Discussion had with RN and plan for IR to assess ability to advance tube and for RD to re-attach bridle once tube in a safe/usable location. Talked with RN again at ~2:30 PM and pt had still not gone to IR. At 4:30 PM call had still not been received concerning plan with IR/tube advancement.    If the tube becomes dislodged please keep the tube and contact the Cortrak team at [email or web address] (password [email or web address]) for replacement.  If after hours and replacement cannot be delayed, place a NG tube and confirm placement with an abdominal x-ray.      Trenton Gammon, MS, RD, LDN, [facility] Inpatient Clinical Dietitian Pager # [phone] After hours/weekend pager # [phone]

## 2017-03-26 NOTE — Care Management Note (Signed)
Case Management Note  Patient Details  Name: Earl Barber MRN: [id number] Date of Birth: [birth year]  Subjective/Objective:  Pt admitted with AMS                   Action/Plan:   PTA from SNF - CSW consulted.  Pt is currently intubated   Expected Discharge Date:                  Expected Discharge Plan:     In-House Referral:     Discharge planning Services  CM Consult  Post Acute Care Choice:    Choice offered to:     DME Arranged:    DME Agency:     HH Arranged:    HH Agency:     Status of Service:     If discussed at [employer], dates discussed:    Additional Comments:  Cherylann Parr, RN 03/26/2017, 2:18 PM

## 2017-03-26 NOTE — Progress Notes (Addendum)
Pt is from [facility] and is a long term resident per admission person. CSW unable to assess at this time as pt is intubated. CSW following for discharge needs.    Claude Manges Chelsee Hosie, MSW, LCSW-A Emergency Department Clinical Social Worker [phone]

## 2017-03-26 NOTE — Progress Notes (Signed)
PULMONARY / CRITICAL CARE MEDICINE   Name: Earl Barber MRN: [id number] DOB: 15-Sep-1945    ADMISSION DATE:  03/15/2017 CONSULTATION DATE:  03/18/2017  REFERRING MD:  Malvin Johns, M.D. / EDP  CHIEF COMPLAINT:  Acute hypoxic respiratory failure  HISTORY OF PRESENT ILLNESS:  History obtained from the emergency department provider as well as the electronic medical record and wife with patient currently intubated. 71 y.o. male with history of chronic renal failure, rheumatoid arthritis, morbid obesity, and atrial fibrillation. Presenting with altered mental status. Currently residing at a local skilled nursing facility. Patient was in his normal state of health approximately 2 days ago oriented and conversational. Over the weekend experienced progressive decline in mental status with noted delusions. Episodes of hypoxia requiring supplemental oxygen were also noted. Staff have not noticed any fever, vomiting, or other illnesses. In the emergency room the patient's respiratory status progressively deteriorated and oxygen requirement continued to increase. In the setting of his continued altered and worsening mentation he was endotracheally intubated. Initially the patient was administered Lasix thinking this could represent a congestive heart failure exacerbation but with the higher probability of healthcare associated pneumonia sepsis protocol was initiated. Patient reportedly hospitalized in October for food impaction despite previous Botox administration for achalasia. Patient was transitioned to rehabilitation last Wednesday. On Friday evening his wife noticed that he was more confused. She reports decreased intake of his usual sustenance: Soups. She denies any aspiration events. Staff did note cough productive of a yellow mucus. He did previously have his influenza vaccine. He is maintained on prednisone 5 mg daily in addition to his monoclonal antibody for treatment of his underlying rheumatoid  arthritis. The patient is also on systemic anticoagulation for his atrial fibrillation.  PAST MEDICAL HISTORY :  He  has a past medical history of Achalasia, Atrial fibrillation ([place]), CHF (congestive heart failure) ([place]), Chronic renal failure, Food impaction of esophagus, and Rheumatoid arthritis ([place]).  PAST SURGICAL HISTORY: He  has a past surgical history that includes Ankle fracture surgery (1978); Esophagogastroduodenoscopy; and Esophagoscopy w/botox injection (01/03/2017).  Allergies  Allergen Reactions  . Bupropion Other (See Comments)    unspecified  . Pilocarpine Other (See Comments)    unspecified  . Prevacid [Lansoprazole] Other (See Comments)    unspecified  . Sulfasalazine Other (See Comments)    unspecified  . Sulfamethoxazole Rash    No current facility-administered medications on file prior to encounter.    Current Outpatient Medications on File Prior to Encounter  Medication Sig  . alfuzosin (UROXATRAL) 10 MG 24 hr tablet Take 10 mg daily with breakfast by mouth.  Marland Kitchen allopurinol (ZYLOPRIM) 300 MG tablet Take 300 mg at bedtime by mouth.  . ARTIFICIAL TEAR OINTMENT OP Place 1 application daily as needed into both eyes (dry eyes).  Marland Kitchen aspirin 81 MG chewable tablet Chew 81 mg daily by mouth.  . calcitRIOL (ROCALTROL) 0.25 MCG capsule Take 0.25 mcg every Monday, Wednesday, and Friday by mouth.  . cyclobenzaprine (FLEXERIL) 5 MG tablet Take 5 mg 2 (two) times daily as needed by mouth for muscle spasms.  . diclofenac sodium (VOLTAREN) 1 % GEL Apply 4 g 4 (four) times daily as needed topically (pain).  Marland Kitchen edoxaban (SAVAYSA) 60 MG TABS tablet Take 60 mg daily by mouth.  . ferrous sulfate 325 (65 FE) MG tablet Take 325 mg daily with breakfast by mouth.  . furosemide (LASIX) 40 MG tablet Take 40 mg daily by mouth.  . gabapentin (NEURONTIN) 100 MG capsule Take 100  mg at bedtime by mouth.  Marland Kitchen HYDROcodone-acetaminophen (NORCO/VICODIN) 5-325 MG tablet Take 1 tablet every 6 (six)  hours as needed by mouth for moderate pain.  Marland Kitchen lisinopril (PRINIVIL,ZESTRIL) 5 MG tablet Take 5 mg daily by mouth.  . metoprolol tartrate (LOPRESSOR) 12.5 mg TABS tablet Take 12.5 mg 2 (two) times daily by mouth.  . morphine (MSIR) 15 MG tablet Take 30-45 mg See admin instructions by mouth. 30mg  by mouth twice daily, 45mg  at bedtime  . ondansetron (ZOFRAN) 4 MG tablet Take 4 mg every 8 (eight) hours by mouth.  . pantoprazole (PROTONIX) 40 MG tablet Take 40 mg 2 (two) times daily by mouth.  . polyethylene glycol (MIRALAX / GLYCOLAX) packet Take 17 g daily by mouth.  . predniSONE (DELTASONE) 5 MG tablet Take 5 mg daily with breakfast by mouth.  . ranitidine (ZANTAC) 150 MG tablet Take 150 mg 2 (two) times daily by mouth.  . sennosides-docusate sodium (SENOKOT-S) 8.6-50 MG tablet Take 1 tablet 2 (two) times daily by mouth.  . Tocilizumab 162 MG/0.9ML SOSY Inject 0.9 mLs once a week into the skin.  venlafaxine XR (EFFEXOR-XR) 150 MG 24 hr capsule Take 150 mg daily with breakfast by mouth.  . vitamin C (ASCORBIC ACID) 500 MG tablet Take 500 mg daily by mouth.    FAMILY HISTORY:  His indicated that the status of his mother is unknown. He indicated that the status of his brother is unknown.   SOCIAL HISTORY: He  reports that he quit smoking about 35 years ago. He started smoking about 67 years ago. He has a 56.00 pack-year smoking history. he has never used smokeless tobacco. He reports that he does not drink alcohol or use drugs.  REVIEW OF SYSTEMS:   Patient intubated but able to endorse generalized malaise. Denies chest pain, or nausea.  SUBJECTIVE:  Febrile at 100.38F at midnight. Episode of hypotension 86/55, phenylephrine gtt initiated. Patient also having episodes of delirium grabbing at ETT. A-line placed and precedex gtt initiated with RASS goal 0 to -1.  VITAL SIGNS: BP 120/83   Pulse 65   Temp 97.7 F (36.5 C) (Oral)   Resp (!) 24   Ht 5\' 9"  (1.753 m)   Wt 263 lb 14.3 oz  (119.7 kg)   SpO2 100%   BMI 38.97 kg/m   HEMODYNAMICS:    VENTILATOR SETTINGS: Vent Mode: PRVC FiO2 (%):  [40 %-50 %] 50 % Set Rate:  [22 bmp] 22 bmp Vt Set:  [570 mL] 570 mL PEEP:  [8 cmH20] 8 cmH20 Pressure Support:  [5 cmH20] 5 cmH20 Plateau Pressure:  [15 cmH20-20 cmH20] 18 cmH20  INTAKE / OUTPUT: I/O last 3 completed shifts: In: 2564.7 [I.V.:2002.2; IV Piggyback:562.5] Out: 3340 [Urine:3340] UOP 1.84 L last 24 hours  PHYSICAL EXAMINATION: General: ill-appearing male lying in bed, NAD with non-toxic appearance HEENT: normocephalic, atraumatic, moist mucous membranes, PERRLA Neck: supple, non-tender without lymphadenopathy Cardiovascular: regular rate and rhythm without murmurs, rubs, or gallops Lungs: coarse breath sounds bilaterally with normal work of breathing on ventilator Abdomen: obese, soft, non-tender, non-distended, normoactive bowel sounds Skin: warm, dry, no rashes or lesions, cap refill < 2 seconds Extremities: warm and well perfused, normal tone, no edema Neuro: sedated while intubated, gross motor function intact on al 4 extremities Psych: mildly agitated when touched but able to orient  LABS:  BMET Recent Labs  Lab 04/11/2017 2041 03/25/17 0607 03/26/17 0619  NA 134* 135 139  K 5.0 5.0 3.6  CL 100* 101  107  CO2 22 21* 22  BUN 71* 70* 67*  CREATININE 3.89* 3.52* 2.95*  GLUCOSE 185* 139* 121*    Electrolytes Recent Labs  Lab 04/11/2017 2041 03/25/17 0607 03/26/17 0619  CALCIUM 8.7* 8.8* 9.3  MG 2.2 2.1  --   PHOS 5.0* 4.4  --     CBC Recent Labs  Lab 03/19/2017 1305 03/25/17 0607 03/26/17 0619  WBC 16.5* 16.2* 21.8*  HGB 10.3* 10.3* 9.4*  HCT 32.0* 32.0* 29.3*  PLT 362 372 467*    Coag's Recent Labs  Lab 04/02/2017 2041  APTT 51*  INR 2.66    Sepsis Markers Recent Labs  Lab 03/15/2017 2041 03/25/17 0138 03/25/17 0607  LATICACIDVEN 1.3 1.1 1.5  PROCALCITON 0.66  --  0.78    ABG Recent Labs  Lab 04/03/2017 1717  03/25/17 0347 03/25/17 0601  PHART 7.296* 7.279* 7.338*  PCO2ART 53.8* 46.5 39.9  PO2ART 146.0* 70.4* 67.0*    Liver Enzymes Recent Labs  Lab 03/22/2017 1305 03/26/17 0619  AST 29 18  ALT 29 21  ALKPHOS 138* 99  BILITOT 1.2 0.9  ALBUMIN 2.8* 2.2*    Cardiac Enzymes Recent Labs  Lab 03/18/2017 2041 03/25/17 0139 03/25/17 0607  TROPONINI <0.03 <0.03 <0.03    Glucose Recent Labs  Lab 03/25/17 1135 03/25/17 1528 03/25/17 1939 03/25/17 2322 03/26/17 0331 03/26/17 0714  GLUCAP 108* 147* 112* 138* 116* 107*    Imaging Ct Head Wo Contrast  Result Date: 03/26/2017 CLINICAL DATA:  Follow-up stroke. EXAM: CT HEAD WITHOUT CONTRAST TECHNIQUE: Contiguous axial images were obtained from the base of the skull through the vertex without intravenous contrast. COMPARISON:  MRI of the head March 26, 2017 at 0437 hours and CT HEAD March 24, 2017. FINDINGS: BRAIN: Evolving RIGHT inferior cerebellar infarct without hemorrhagic conversion. Regional mass effect, patent fourth ventricle. Known lung small cerebellar infarcts and tiny supratentorial cortical infarcts better demonstrated on prior MRI. Ventricles and sulci are overall normal for patient's age. Faint supratentorial white matter hypodensities compatible with mild chronic small vessel ischemic disease. No midline shift, mass effect or acute large vascular territory infarcts. Basal cisterns are patent. VASCULAR: Trace calcific atherosclerosis of the carotid siphons. SKULL: No skull fracture. No significant scalp soft tissue swelling. SINUSES/ORBITS: Subcentimeter RIGHT maxillary mucosal retention cysts without paranasal sinus air-fluid levels. Mastoid air cells are well aerated. The included ocular globes and orbital contents are non-suspicious. OTHER: Life-support lines in place. IMPRESSION: 1. Evolving acute RIGHT cerebellar/posterior-inferior cerebellar artery territory infarct without hemorrhagic conversion. 2. Regional mass effect,  no hydrocephalus. Electronically Signed   By: Awilda Metro M.D.   On: 03/26/2017 05:58   Mr Brain Wo Contrast  Result Date: 03/26/2017 CLINICAL DATA:  Altered mental status. History of chronic renal failure, atrial fibrillation. Suspect stroke. EXAM: MRI HEAD WITHOUT CONTRAST TECHNIQUE: Multiplanar, multiecho pulse sequences of the brain and surrounding structures were obtained without intravenous contrast. COMPARISON:  CT HEAD March 24, 2017 FINDINGS: BRAIN: RIGHT inferior cerebellar reduced diffusion with low ADC values and bright T2 signal corresponding to CT abnormality. Regional mass effect, fourth ventricle remains patent. A few micro hemorrhages RIGHT cerebellum. No lobar hematoma. Old small bilateral cerebellar infarcts.Multiple tiny old bifrontal parietal cortical infarcts. Ventricles and sulci are normal for patient's age. No midline shift, mass effect or masses. No abnormal extra-axial fluid collections. VASCULAR: Normal major intracranial vascular flow voids present at skull base. SKULL AND UPPER CERVICAL SPINE: No abnormal sellar expansion. No suspicious calvarial bone marrow signal. Craniocervical junction maintained.  SINUSES/ORBITS: Trace paranasal sinus mucosal thickening. The included ocular globes and orbital contents are non-suspicious. OTHER: Partially imaged parotid glands appear mildly enlarged. IMPRESSION: 1. Acute RIGHT cerebellar infarct with trace petechial hemorrhage. Regional mass effect, no hydrocephalus. 2. Tiny old frontoparietal cortical infarcts. Old small bilateral cerebellar infarcts. 3. Mild chronic small vessel ischemic disease. Electronically Signed   By: Awilda Metro M.D.   On: 03/26/2017 05:22   Dg Chest Port 1 View  Result Date: 03/26/2017 CLINICAL DATA:  71 year old male status post central line placement. EXAM: PORTABLE CHEST 1 VIEW COMPARISON:  Chest radiograph dated 03/25/2017 FINDINGS: Endotracheal tube above the carina in stable positioning. There  has been interval placement of a left IJ central venous catheter with tip projecting over the upper mediastinum to the left of the midline. This catheter is likely in the left innominate vein close to the junction with the SVC. There is no pneumothorax. There is cardiomegaly with bilateral perihilar and central interstitial streaky densities, likely vascular congestion and edema. Is probable small left pleural effusion. No focal consolidation. The osseous structures appear unremarkable. IMPRESSION: 1. Interval placement of a left IJ central line. The tip of central line is likely in the left innominate vein close to the SVC. No pneumothorax. Endotracheal tube remains above the carina in similar positioning. 2. Cardiomegaly with vascular congestion and edema. Probable small left pleural effusion. Electronically Signed   By: Elgie Collard M.D.   On: 03/26/2017 02:35     STUDIES:  CXR 11/11 >> mild perihilar and bibasilar opacification likely interstitial edema, less likely infectious CT head WO 11/11 >> moderate region of low attenuation in the inferior aspect of right cerebellar hemisphere likely acute to subacute infarction, although chronic ischemia changes possible, recommending MRI, minimal chronic ischemic microvascular disease evident CXR 11/11 >> stable ETT, low lung volumes, stable mild cardiomegaly, stable linear and hazy perihilar and bibasilar lung opacities favoring mild pulmonary edema and atelectasis CXR 11/12 >> stable ETT, stable cardiomegaly with increased bilateral edema, left greater than right pleural effusion, bibasilar airspace disease likely representing atelectasis in CHF, less likely infectious CXR 11/12 >> LIJ stable, no pneumothorax, cardiomegaly without edema, probable small L-pleural effusion MRI brain WO 11/13 >> acute right cerebellar infarct with trace petecial hemorrahe, region mass effect, no hydrocephalus CT head WO 11/13 >> evolving right cerebellar/posterior-inferior  cerebellar artery territory infarct without hemorrhagic conversion, regional mass effect, no hydrocephalus  CULTURES: Respiratory culture 11/12 >> abundant WBC, predominantly PMNs, abundant GPC, culture pending Blood culture 11/11 >> NGD1 Urine culture 11/11 >> NGTD RVP PCR 11/11 >> NGTD  ANTIBIOTICS: Azithromycin 11/11 Vancomycin IV 11/11 >> Zosyn IV 11/11 >>  SIGNIFICANT EVENTS: 11/11 >> admit for AMS requiring intubation 11/12 >> phenylephrine gtt started for hypotension 11/13 >> MRI confirmed right cerebellar infract with regional mass effect  LINES/TUBES: ETT 11/11 >> Arterial line R-radial 11/12 >> LIJ triple lumen 11/13 >> PIV L-hand 11/11 >> PIV L-wrist 11/11 >11/11 PIV L-forearm 11/11 >> Urethral catheter 11/11 >>  DISCUSSION: Patient is a 71 year old male on chronic suppression with monoclonal antibody for rheumatoid arthritis as well as prednisone 5 mg daily and known achalasia with repetitive food impaction presenting with acute encephalopathy and acute hypoxic respiratory failure with hypotension suspected to be secondary to HAP requiring intubation and pressor support.  ASSESSMENT / PLAN:  PULMONARY Acute hypoxic respiratory failure - Continue full vent support - Treatment for HAP per below - Continuous pulse ox  - Oral hygiene   CARDIOVASCULAR Elevated troponin I:  Suspected cardiac ischemia in the setting of shock and hypoxia Atrial fibrillation Hypotension: off levophed 11/12 - Telemetry - Daily weights and I&O - Trending troponin - Holding anticoagulant  RENAL   Acute on chronic renal failure: Suspected to be secondary to hypotension, improving (BL ) Hyperkalemia  - Continuing fluid resuscitation - Avoid nephrotoxic agents - Trend electrolytes daily - Foley cath, daily weight, I&O - Replete electrolytes as indicated - Telemetry - KVO  GASTROINTESTINAL Nutrition Achalasia on liquid diet at home - Protonix IV for SUP -  NPO  HEMATOLOGIC Anemia: Stable without signs of active bleeding Coagulopathy with chronic oral anticoagulation - Trending CBC, INR and PTT - Holding home anticoagulant - Heparin SQ  INFECTIOUS   Septic shock secondary to healthcare acquired pneumonia  - Continue Zosyn IV Q6H day 2  - Continue Vancomycin IV every 48 hours day 2 - Urine Legionella antigens pending - Trend fever curve, WBC, procalcitonin - Droplet precautions  ENDOCRINE Suspected adrenal insufficiency Chronic glucose corticoid use  - Continue stress dose steroids with hydrocortisone 50 mg IV every 6 hours  NEUROLOGIC  Acute encephalopathy: Likely multifactorial from toxic state, hypoxia, and hypotension - RASS goal: 0 - Versed 2 mg Q1H PRN / fentanyl 50 mcg Q1H PRN   FAMILY  - Updates: wife at bedside this morning - Inter-disciplinary family meet or Palliative Care meeting due by: 03/31/2017   Durward Parcel, DO [place]

## 2017-03-27 ENCOUNTER — Inpatient Hospital Stay (HOSPITAL_COMMUNITY): Payer: Medicare HMO

## 2017-03-27 DIAGNOSIS — Z515 Encounter for palliative care: Secondary | ICD-10-CM

## 2017-03-27 DIAGNOSIS — Z7189 Other specified counseling: Secondary | ICD-10-CM

## 2017-03-27 LAB — BASIC METABOLIC PANEL
ANION GAP: 9 (ref 5–15)
BUN: 61 mg/dL — AB (ref 6–20)
CHLORIDE: 112 mmol/L — AB (ref 101–111)
CO2: 22 mmol/L (ref 22–32)
Calcium: 9.9 mg/dL (ref 8.9–10.3)
Creatinine, Ser: 2.28 mg/dL — ABNORMAL HIGH (ref 0.61–1.24)
GFR calc Af Amer: 31 mL/min — ABNORMAL LOW (ref >60)
GFR calc non Af Amer: 27 mL/min — ABNORMAL LOW (ref >60)
GLUCOSE: 131 mg/dL — AB (ref 65–99)
POTASSIUM: 3.1 mmol/L — AB (ref 3.5–5.1)
Sodium: 143 mmol/L (ref 135–145)

## 2017-03-27 LAB — GLUCOSE, CAPILLARY
GLUCOSE-CAPILLARY: 126 mg/dL — AB (ref 65–99)
GLUCOSE-CAPILLARY: 149 mg/dL — AB (ref 65–99)
Glucose-Capillary: 120 mg/dL — ABNORMAL HIGH (ref 65–99)
Glucose-Capillary: 113 mg/dL — ABNORMAL HIGH (ref 65–99)
Glucose-Capillary: 184 mg/dL — ABNORMAL HIGH (ref 65–99)

## 2017-03-27 LAB — CBC
HEMATOCRIT: 27.8 % — AB (ref 39.0–52.0)
Hemoglobin: 9 g/dL — ABNORMAL LOW (ref 13.0–17.0)
MCH: 32.7 pg (ref 26.0–34.0)
MCHC: 32.4 g/dL (ref 30.0–36.0)
MCV: 101.1 fL — AB (ref 78.0–100.0)
PLATELETS: 308 10*3/uL (ref 150–400)
RBC: 2.75 MIL/uL — AB (ref 4.22–5.81)
RDW: 14.7 % (ref 11.5–15.5)
WBC: 11.5 10*3/uL — AB (ref 4.0–10.5)

## 2017-03-27 LAB — CULTURE, RESPIRATORY

## 2017-03-27 LAB — CULTURE, RESPIRATORY W GRAM STAIN

## 2017-03-27 MED ORDER — POTASSIUM CHLORIDE 10 MEQ/50ML IV SOLN
10.0000 meq | INTRAVENOUS | Status: AC
  Administered 2017-03-27 (×3): 10 meq via INTRAVENOUS
  Filled 2017-03-27 (×3): qty 50

## 2017-03-27 MED ORDER — ASPIRIN 300 MG RE SUPP
300.0000 mg | Freq: Every day | RECTAL | Status: DC
Start: 2017-03-27 — End: 2017-03-28
  Administered 2017-03-27 – 2017-03-28 (×2): 300 mg via RECTAL
  Filled 2017-03-27 (×3): qty 1

## 2017-03-27 MED ORDER — SODIUM CHLORIDE 0.9 % IV SOLN
0.4000 ug/kg/h | INTRAVENOUS | Status: DC
  Administered 2017-03-28: 0.8 ug/kg/h via INTRAVENOUS
  Administered 2017-03-28: 1 ug/kg/h via INTRAVENOUS
  Administered 2017-03-28: 0.5 ug/kg/h via INTRAVENOUS
  Filled 2017-03-27: qty 2

## 2017-03-27 MED ORDER — FENTANYL CITRATE (PF) 100 MCG/2ML IJ SOLN
25.0000 ug | INTRAMUSCULAR | Status: DC | PRN
Start: 2017-03-27 — End: 2017-03-28
  Administered 2017-03-27 (×2): 25 ug via INTRAVENOUS
  Filled 2017-03-27 (×2): qty 2

## 2017-03-27 MED ORDER — POTASSIUM CHLORIDE 20 MEQ/15ML (10%) PO SOLN
40.0000 meq | Freq: Once | ORAL | Status: DC
  Filled 2017-03-27: qty 30

## 2017-03-27 MED ORDER — CEFAZOLIN SODIUM-DEXTROSE 2-4 GM/100ML-% IV SOLN
2.0000 g | Freq: Three times a day (TID) | INTRAVENOUS | Status: DC
  Administered 2017-03-27 – 2017-03-29 (×7): 2 g via INTRAVENOUS
  Filled 2017-03-27 (×7): qty 100

## 2017-03-27 MED ORDER — WHITE PETROLATUM EX OINT
TOPICAL_OINTMENT | CUTANEOUS | Status: AC
  Administered 2017-03-27: 1
  Filled 2017-03-27: qty 28.35

## 2017-03-27 NOTE — Progress Notes (Signed)
eLink Physician-Brief Progress Note Patient Name: Earl Barber DOB: 04/02/1946 MRN: [id number]   Date of Service  03/27/2017  HPI/Events of Note  Patient with severe agitation  eICU Interventions  We will start Precedex infusion     Intervention Category Evaluation Type: Other  Erin Fulling 03/27/2017, 11:24 PM

## 2017-03-27 NOTE — Progress Notes (Signed)
eLink Physician-Brief Progress Note Patient Name: Earl Barber DOB: 03-17-1946 MRN: [id number]   Date of Service  03/27/2017  HPI/Events of Note  K+ = 3.1 and Creatinine - 2.28(improved from 4.24)  eICU Interventions  Will replace K+.     Intervention Category Major Interventions: Electrolyte abnormality - evaluation and management  [name] 03/27/2017, 5:28 AM

## 2017-03-27 NOTE — Progress Notes (Signed)
PULMONARY / CRITICAL CARE MEDICINE   Name: Earl Barber MRN: [id number] DOB: 15-Sep-1945    ADMISSION DATE:  03/15/2017 CONSULTATION DATE:  03/18/2017  REFERRING MD:  Malvin Johns, M.D. / EDP  CHIEF COMPLAINT:  Acute hypoxic respiratory failure  HISTORY OF PRESENT ILLNESS:  History obtained from the emergency department provider as well as the electronic medical record and wife with patient currently intubated. 71 y.o. male with history of chronic renal failure, rheumatoid arthritis, morbid obesity, and atrial fibrillation. Presenting with altered mental status. Currently residing at a local skilled nursing facility. Patient was in his normal state of health approximately 2 days ago oriented and conversational. Over the weekend experienced progressive decline in mental status with noted delusions. Episodes of hypoxia requiring supplemental oxygen were also noted. Staff have not noticed any fever, vomiting, or other illnesses. In the emergency room the patient's respiratory status progressively deteriorated and oxygen requirement continued to increase. In the setting of his continued altered and worsening mentation he was endotracheally intubated. Initially the patient was administered Lasix thinking this could represent a congestive heart failure exacerbation but with the higher probability of healthcare associated pneumonia sepsis protocol was initiated. Patient reportedly hospitalized in October for food impaction despite previous Botox administration for achalasia. Patient was transitioned to rehabilitation last Wednesday. On Friday evening his wife noticed that he was more confused. She reports decreased intake of his usual sustenance: Soups. She denies any aspiration events. Staff did note cough productive of a yellow mucus. He did previously have his influenza vaccine. He is maintained on prednisone 5 mg daily in addition to his monoclonal antibody for treatment of his underlying rheumatoid  arthritis. The patient is also on systemic anticoagulation for his atrial fibrillation.  PAST MEDICAL HISTORY :  He  has a past medical history of Achalasia, Atrial fibrillation ([place]), CHF (congestive heart failure) ([place]), Chronic renal failure, Food impaction of esophagus, and Rheumatoid arthritis ([place]).  PAST SURGICAL HISTORY: He  has a past surgical history that includes Ankle fracture surgery (1978); Esophagogastroduodenoscopy; and Esophagoscopy w/botox injection (01/03/2017).  Allergies  Allergen Reactions  . Bupropion Other (See Comments)    unspecified  . Pilocarpine Other (See Comments)    unspecified  . Prevacid [Lansoprazole] Other (See Comments)    unspecified  . Sulfasalazine Other (See Comments)    unspecified  . Sulfamethoxazole Rash    No current facility-administered medications on file prior to encounter.    Current Outpatient Medications on File Prior to Encounter  Medication Sig  . alfuzosin (UROXATRAL) 10 MG 24 hr tablet Take 10 mg daily with breakfast by mouth.  Marland Kitchen allopurinol (ZYLOPRIM) 300 MG tablet Take 300 mg at bedtime by mouth.  . ARTIFICIAL TEAR OINTMENT OP Place 1 application daily as needed into both eyes (dry eyes).  Marland Kitchen aspirin 81 MG chewable tablet Chew 81 mg daily by mouth.  . calcitRIOL (ROCALTROL) 0.25 MCG capsule Take 0.25 mcg every Monday, Wednesday, and Friday by mouth.  . cyclobenzaprine (FLEXERIL) 5 MG tablet Take 5 mg 2 (two) times daily as needed by mouth for muscle spasms.  . diclofenac sodium (VOLTAREN) 1 % GEL Apply 4 g 4 (four) times daily as needed topically (pain).  Marland Kitchen edoxaban (SAVAYSA) 60 MG TABS tablet Take 60 mg daily by mouth.  . ferrous sulfate 325 (65 FE) MG tablet Take 325 mg daily with breakfast by mouth.  . furosemide (LASIX) 40 MG tablet Take 40 mg daily by mouth.  . gabapentin (NEURONTIN) 100 MG capsule Take 100  mg at bedtime by mouth.  Marland Kitchen HYDROcodone-acetaminophen (NORCO/VICODIN) 5-325 MG tablet Take 1 tablet every 6 (six)  hours as needed by mouth for moderate pain.  Marland Kitchen lisinopril (PRINIVIL,ZESTRIL) 5 MG tablet Take 5 mg daily by mouth.  . metoprolol tartrate (LOPRESSOR) 12.5 mg TABS tablet Take 12.5 mg 2 (two) times daily by mouth.  . morphine (MSIR) 15 MG tablet Take 30-45 mg See admin instructions by mouth. 30mg  by mouth twice daily, 45mg  at bedtime  . ondansetron (ZOFRAN) 4 MG tablet Take 4 mg every 8 (eight) hours by mouth.  . pantoprazole (PROTONIX) 40 MG tablet Take 40 mg 2 (two) times daily by mouth.  . polyethylene glycol (MIRALAX / GLYCOLAX) packet Take 17 g daily by mouth.  . predniSONE (DELTASONE) 5 MG tablet Take 5 mg daily with breakfast by mouth.  . ranitidine (ZANTAC) 150 MG tablet Take 150 mg 2 (two) times daily by mouth.  . sennosides-docusate sodium (SENOKOT-S) 8.6-50 MG tablet Take 1 tablet 2 (two) times daily by mouth.  . Tocilizumab 162 MG/0.9ML SOSY Inject 0.9 mLs once a week into the skin.  Marland Kitchen venlafaxine XR (EFFEXOR-XR) 150 MG 24 hr capsule Take 150 mg daily with breakfast by mouth.  . vitamin C (ASCORBIC ACID) 500 MG tablet Take 500 mg daily by mouth.    FAMILY HISTORY:  His indicated that the status of his mother is unknown. He indicated that the status of his brother is unknown.   SOCIAL HISTORY: He  reports that he quit smoking about 35 years ago. He started smoking about 67 years ago. He has a 56.00 pack-year smoking history. he has never used smokeless tobacco. He reports that he does not drink alcohol or use drugs.  REVIEW OF SYSTEMS:   Patient intubated but able to endorse generalized malaise, denies chest pain or nausea.  SUBJECTIVE:  Afebrile with stable vitals.  Agitated overnight requiring soft restraints.  Tolerating full vent support without need for pressors.  VITAL SIGNS: BP (!) 163/81   Pulse 99   Temp (!) 97.5 F (36.4 C) (Oral) Comment: Victorino Dike, RN notified  Resp 20   Ht 5\' 9"  (1.753 m)   Wt 264 lb 8.8 oz (120 kg)   SpO2 100%   BMI 39.07 kg/m    HEMODYNAMICS:    VENTILATOR SETTINGS: Vent Mode: PRVC FiO2 (%):  [40 %] 40 % Set Rate:  [16 bmp] 16 bmp Vt Set:  [570 mL] 570 mL PEEP:  [5 cmH20] 5 cmH20 Plateau Pressure:  [11 cmH20-16 cmH20] 11 cmH20  INTAKE / OUTPUT: I/O last 3 completed shifts: In: 2768.5 [I.V.:2058.5; IV Piggyback:710] Out: 2275 [Urine:2275] UOP 1.615 L last 24 hours  PHYSICAL EXAMINATION: General: ill-appearing male lying in bed, NAD with non-toxic appearance HEENT: normocephalic, atraumatic, moist mucous membranes, PERRLA Neck: supple, non-tender without lymphadenopathy Cardiovascular: regular rate and rhythm without murmurs, rubs, or gallops Lungs: coarse breath sounds bilaterally with normal work of breathing on ventilator Abdomen: obese, soft, non-tender, non-distended, normoactive bowel sounds Skin: warm, dry, no rashes or lesions, cap refill < 2 seconds Extremities: warm and well perfused, normal tone, no edema Neuro: Sedated while intubated, gross motor function intact in all 4 extremities Psych: Minimally agitated but able to reorient  LABS:  BMET Recent Labs  Lab 03/25/17 0607 03/26/17 0619 03/27/17 0345  NA 135 139 143  K 5.0 3.6 3.1*  CL 101 107 112*  CO2 21* 22 22  BUN 70* 67* 61*  CREATININE 3.52* 2.95* 2.28*  GLUCOSE 139* 121*  131*    Electrolytes Recent Labs  Lab 2017/04/22 2041 03/25/17 0607 03/26/17 0619 03/27/17 0345  CALCIUM 8.7* 8.8* 9.3 9.9  MG 2.2 2.1  --   --   PHOS 5.0* 4.4  --   --     CBC Recent Labs  Lab 03/25/17 0607 03/26/17 0619 03/27/17 0345  WBC 16.2* 21.8* 11.5*  HGB 10.3* 9.4* 9.0*  HCT 32.0* 29.3* 27.8*  PLT 372 467* 308    Coag's Recent Labs  Lab 04/22/17 2041  APTT 51*  INR 2.66    Sepsis Markers Recent Labs  Lab Apr 22, 2017 2041 03/25/17 0138 03/25/17 0607 03/26/17 0619  LATICACIDVEN 1.3 1.1 1.5  --   PROCALCITON 0.66  --  0.78 0.58    ABG Recent Labs  Lab April 22, 2017 1717 03/25/17 0347 03/25/17 0601  PHART 7.296*  7.279* 7.338*  PCO2ART 53.8* 46.5 39.9  PO2ART 146.0* 70.4* 67.0*    Liver Enzymes Recent Labs  Lab 04-22-2017 1305 03/26/17 0619  AST 29 18  ALT 29 21  ALKPHOS 138* 99  BILITOT 1.2 0.9  ALBUMIN 2.8* 2.2*    Cardiac Enzymes Recent Labs  Lab 2017-04-22 2041 03/25/17 0139 03/25/17 0607  TROPONINI <0.03 <0.03 <0.03    Glucose Recent Labs  Lab 03/26/17 1118 03/26/17 1710 03/26/17 2001 03/26/17 2329 03/27/17 0357 03/27/17 0720  GLUCAP 103* 136* 105* 121* 120* 113*    Imaging Dg Chest Port 1 View  Result Date: 03/27/2017 CLINICAL DATA:  Respiratory failure. EXAM: PORTABLE CHEST 1 VIEW COMPARISON:  One-view chest x-ray 03/26/2017 FINDINGS: Endotracheal tube is stable, 6 cm above the carina. A left IJ line is stable. The heart is enlarged. Interstitial edema and bilateral effusions are not significantly changed. Bibasilar airspace disease remains. Aortic atherosclerosis is noted. IMPRESSION: 1. Support apparatus is stable. 2. Stable cardiomegaly, edema, and bilateral effusions compatible with congestive heart failure. 3. Stable to slight increase in bilateral bibasilar airspace disease, likely atelectasis. Infection is considered less likely. Electronically Signed   By: Marin Roberts M.D.   On: 03/27/2017 06:46   Dg Vangie Bicker G Tube Plc W/fl W/rad  Result Date: 03/26/2017 CLINICAL DATA:  Attempted feeding tube placement. EXAM: NASO G TUBE PLACEMENT WITH FL AND WITH RAD FLUOROSCOPY TIME:  Fluoroscopy Time:  25 minutes and 45 seconds Number of Acquired Spot Images: 0 COMPARISON:  Attempted exam of 1 day earlier. FINDINGS: Despite multiple attempts by the technologist and the radiologist, the feeding tube could not be placed successfully into the stomach. Due to excessive radiation exposure of nearly 26 minutes, decision was made to forego the procedure unsuccessfully. IMPRESSION: Unsuccessful placement of feeding tube, as detailed above. Electronically Signed   By: Jeronimo Greaves  M.D.   On: 03/26/2017 17:27     STUDIES:  CXR 11/11 >> mild perihilar and bibasilar opacification likely interstitial edema, less likely infectious CT head WO 11/11 >> moderate region of low attenuation in the inferior aspect of right cerebellar hemisphere likely acute to subacute infarction, although chronic ischemia changes possible, recommending MRI, minimal chronic ischemic microvascular disease evident CXR 11/11 >> stable ETT, low lung volumes, stable mild cardiomegaly, stable linear and hazy perihilar and bibasilar lung opacities favoring mild pulmonary edema and atelectasis CXR 11/12 >> stable ETT, stable cardiomegaly with increased bilateral edema, left greater than right pleural effusion, bibasilar airspace disease likely representing atelectasis in CHF, less likely infectious CXR 11/12 >> LIJ stable, no pneumothorax, cardiomegaly without edema, probable small L-pleural effusion MRI brain WO 11/13 >> acute right  cerebellar infarct with trace petecial hemorrahe, region mass effect, no hydrocephalus CT head WO 11/13 >> evolving right cerebellar/posterior-inferior cerebellar artery territory infarct without hemorrhagic conversion, regional mass effect, no hydrocephalus CXR 11/14 >> support apparatus stable, stable cardiomegaly, edema, and bilateral effusions compatible with heart failure, stable slight increase in bilateral bibasilar airspace disease likely atelectasis with infection less likely  CULTURES: Respiratory culture 11/12 >> abundant WBC, predominantly PMNs, abundant GPC, culture pending Blood culture 11/11 >> NGD2 Urine culture 11/11 >> NGTD RVP PCR 11/11 >> NGTD  ANTIBIOTICS: Azithromycin 11/11-11/12 Vancomycin IV 11/11-11/14 Zosyn IV 11/11-11/14 Cefazolin 11/14 >>  SIGNIFICANT EVENTS: 11/11 >> admit for AMS requiring intubation 11/12 >> phenylephrine gtt started for hypotension 11/13 >> MRI confirmed right cerebellar infract with regional mass  effect  LINES/TUBES: ETT 11/11 >> Arterial line R-radial 11/12 >> LIJ triple lumen 11/13 >> PIV L-hand 11/11 >> PIV L-wrist 11/11-11/12 PIV L-forearm 11/11 >> Urethral catheter 11/11 >>  DISCUSSION: Patient is a 71 year old male on chronic suppression with monoclonal antibody for rheumatoid arthritis as well as prednisone 5 mg daily and known achalasia with repetitive food impaction presenting with acute encephalopathy and acute hypoxic respiratory failure with hypotension suspected to be secondary to HAP requiring intubation and pressor support.  ASSESSMENT / PLAN:  PULMONARY Acute hypoxic respiratory failure - Continue full vent support - Treatment for HAP per below - Continuous pulse ox  - Oral hygiene   CARDIOVASCULAR Elevated troponin I: Suspected cardiac ischemia in the setting of shock and hypoxia Atrial fibrillation Hypotension: off levophed 11/12 - Telemetry - Daily weights and I&O - Holding home Savaysa given acute stroke, continue daily aspirin 81 mg  RENAL   Acute on chronic renal failure: Suspected to be secondary to hypotension, improving (BL ) Hyperkalemia  - Continuing fluid resuscitation - Avoid nephrotoxic agents - Trend electrolytes daily - Foley cath, daily weight, I&O - Replete electrolytes as indicated - Telemetry - KVO  GASTROINTESTINAL Nutrition Achalasia on liquid diet at home - Protonix IV for SUP - NPO  HEMATOLOGIC Anemia: Stable without signs of active bleeding Coagulopathy with chronic oral anticoagulation - Trending CBC, INR and PTT - Holding home Savaysa given acute stroke, continue daily aspirin 81 mg - Heparin SQ  INFECTIOUS   Septic shock secondary to healthcare acquired pneumonia  - D/c Zosyn IV Q6H day 4 - D/c Vancomycin IV every 48 hours day 4 - Starting day 1 cefazolin - Trend fever curve, WBC, procalcitonin - Droplet precautions  ENDOCRINE Suspected adrenal insufficiency Chronic glucose corticoid use  - Continue  stress dose steroids with hydrocortisone 50 mg IV every 6 hours  NEUROLOGIC  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

## 2017-03-27 NOTE — Procedures (Signed)
Extubation Procedure Note  Patient Details:   Name: Earl Barber DOB: 1945/07/10 MRN: [id number]   Airway Documentation:     Evaluation  O2 sats: stable throughout Complications: No apparent complications Patient did tolerate procedure well. Bilateral Breath Sounds: Diminished   Yes   Pt extubated to 3L [place] per MD order. Pt stable throughout with no compications. Pt coughed up several large blood clots prior to extubation. Pt with strong productive cough, and able to speak with no stridor noted post extubation. VS within normal limits, no complications noted. RT will continue to monitor. Pt to wear bipap PRN   Carolan Shiver 03/27/2017, 2:36 PM

## 2017-03-27 NOTE — Consult Note (Signed)
Consultation Note Date: 03/27/2017   Patient Name: Earl Barber  DOB: 25-Jan-1946  MRN: [id number]  Age / Sex: 71 y.o., male  PCP: Patient, No Pcp Per ([place]) Referring Physician: Javier Glazier, MD  Reason for Consultation: Establishing goals of care  HPI/Patient Profile: 71 y.o. male  with past medical history of achalasia with recurrent esophageal obstructions, CHF (EF 40% with septal dyssynchrony) Afib, CKD, RA who was admitted on 03/18/2017 with septic shock secondary to HCAP.  He was noted to have acute encephalopathy.  MR brain reveals multiple small previous strokes as well as an acute CVA (right cerebellar CVA with regional mass effect and small hemorrhage).  He required intubation, but was extubated today 11/14.  Clinical Assessment and Goals of Care:  I have reviewed medical records including EPIC notes, labs and imaging, received report from the ICU RN, assessed the patient and then met at the bedside along with his wife and primary care taker Arbie Cookey  to discuss diagnosis prognosis, GOC, EOL wishes, disposition and options.  We discussed a brief life review of the patient. 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  In 1978 he became disabled after a bad ankle fracture and has been unable to work since.  He receives his medical [facility] thru [facility].   His wife used to produce Dog shows.  She was also a Camera operator for many years.  She raises and sells [place].  Arbie Cookey and her husband derive their support from their [place].  She states she has a wonderful church family.  He has been chronically ill with RA for many years.  According to his wife his shoulders, knees, and hips are "shot".  He had difficulty ambulating from bed to chair with the assistance of his wife at home.  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  He had known difficulty with aspiration prior to this admission due to severe achalasia.  He is only able to drink liquids.  Absolutely no solid food.  He has had problems with intermittent confusion for a long time (years?). His wife is his sole care giver.  She understands well that he is chronically ill and that at this point he is at high risk of death.  If the worst happens his wife would like for [place] to intubate and use pressors 1 more time.  His daughter lives in [place] state.  They are making plans to come to [place] now.  The daughter has complicated grief as she was not present when her mother died years ago from [place].  We discussed PEG tubes - he wife appears to want comfort feedings instead.  Being able to eat is very important to her husband and weighs heavily in his quality of life. She said specifically he would not want trach.  He would not want to live long term in a nursing facility. We discussed Hospice and [place].  She was appreciative  of the information   Questions and concerns were addressed.  Hard Choices booklet left for review. The family was encouraged to call with questions or concerns.   PMT agreed to continue to support Mrs. Kulick while her husband "wakes up" after extubation - and we can determine what mental and physical functionality he has post stroke.   Primary Decision Maker:  NEXT OF KIN  Wife Carol    SUMMARY OF RECOMMENDATIONS    Code Status/Advance Care Planning: Partial - Intubate and pressors if needed in order to allow him to survive until his daughter arrives.  Symptom Management:  Defer to CCM.  Patient is on chronic opioids (morphine sulfate 30 mg TID+ (2) vicodin TID).  He hallucinates and develops withdraw quickly when his dosage is reduced. Has severe back pain - normally can not lie on his back.   Palliative Prophylaxis:   Aspiration.  He will aspirate.  Arbie Cookey wants him to receive the safest diet possible (prior he  could only take liquids due to Achalasia).  She understands he will aspirate.  Psycho-social/Spiritual:   Desire for further Chaplaincy support: private pastor visiting.  Prognosis:  Poor.  Possible hospital death vs d/c with hospice services.  Will have to reassess day by day.    Discharge Planning: To Be Determined      Primary Diagnoses: Present on Admission: . HCAP (healthcare-associated pneumonia)   I have reviewed the medical record, interviewed the patient and family, and examined the patient. The following aspects are pertinent.  Past Medical History:  Diagnosis Date  . Achalasia   . Atrial fibrillation ([place])   . CHF (congestive heart failure) ([place])   . Chronic renal failure   . Food impaction of esophagus   . Rheumatoid arthritis ([place])    Social History   Socioeconomic History  . Marital status: Married    Spouse name: None  . Number of children: None  . Years of education: None  . Highest education level: None  Social Needs  . Financial resource strain: None  . Food insecurity - worry: None  . Food insecurity - inability: None  . Transportation needs - medical: None  . Transportation needs - non-medical: None  Occupational History  . None  Tobacco Use  . Smoking status: Former Smoker    Packs/day: 2.00    Years: 28.00    Pack years: 56.00    Start date: 07/04/1949    Last attempt to quit: 05/14/1981    Years since quitting: 35.8  . Smokeless tobacco: Never Used  Substance and Sexual Activity  . Alcohol use: No    Frequency: Never    Comment: Remote EtOH  . Drug use: No  . Sexual activity: Not Currently  Other Topics Concern  . None  Social History Narrative   [facility] (04/02/2017):   Gets his care through the [facility]. Patient married for 41 years. Has one adult daughter living in the [place]. Does have dogs at home. Remote bird exposure. Previously worked in Scientist, research (medical) and also with [employer]. Currently uses a scooter  to mobilize long distances. Was walking short distances with a walker up until recently. Wife Arbie Cookey can be reached at [phone].   Family History  Problem Relation Age of Onset  . Rheum arthritis Mother   . Congestive Heart Failure Mother   . Rheum arthritis Brother   . Atrial fibrillation Brother    Scheduled Meds: . aspirin  300 mg Rectal Daily  . chlorhexidine gluconate (MEDLINE  KIT)  15 mL Mouth Rinse BID  . Chlorhexidine Gluconate Cloth  6 each Topical Daily  . heparin  5,000 Units Subcutaneous Q8H  . hydrocortisone sod succinate (SOLU-CORTEF) inj  50 mg Intravenous Q6H  . insulin aspart  0-9 Units Subcutaneous Q4H  . mouth rinse  15 mL Mouth Rinse QID  . pantoprazole (PROTONIX) IV  40 mg Intravenous Q12H  . sodium chloride flush  10-40 mL Intracatheter Q12H   Continuous Infusions: . sodium chloride 250 mL (03/25/17 0700)  .  ceFAZolin (ANCEF) IV Stopped (03/27/17 1152)  . famotidine (PEPCID) IV Stopped (03/27/17 1058)  . phenylephrine (NEO-SYNEPHRINE) Adult infusion Stopped (03/26/17 1240)   PRN Meds:.sodium chloride, fentaNYL (SUBLIMAZE) injection, sodium chloride flush Allergies  Allergen Reactions  . Bupropion Other (See Comments)    unspecified  . Pilocarpine Other (See Comments)    unspecified  . Prevacid [Lansoprazole] Other (See Comments)    unspecified  . Sulfasalazine Other (See Comments)    unspecified  . Sulfamethoxazole Rash   Review of Systems intubated  Physical Exam  Well developed, chronically ill appearing male, awake, intubated, mildly agitated, mitts on in ICU CV difficult to hear Resp increased work of breathing (belly breathing) poor air movement Abdomen active bowel sounds, soft, non distended Ext 2+ edema  Vital Signs: BP 114/64   Pulse (!) 109   Temp (!) 97.4 F (36.3 C) (Oral) Comment: Jennifer. RN notified  Resp 16   Ht 5' 9"  (1.753 m)   Wt 120 kg (264 lb 8.8 oz)   SpO2 98%   BMI 39.07 kg/m  Pain Assessment: CPOT   Pain  Score: Asleep   SpO2: SpO2: 98 % O2 Device:SpO2: 98 % O2 Flow Rate: .O2 Flow Rate (L/min): 5 L/min  IO: Intake/output summary:   Intake/Output Summary (Last 24 hours) at 03/27/2017 1430 Last data filed at 03/27/2017 1400 Gross per 24 hour  Intake 675.5 ml  Output 1600 ml  Net -924.5 ml    LBM: Last BM Date: 03/23/17 Baseline Weight: Weight: 99.8 kg (220 lb) Most recent weight: Weight: 120 kg (264 lb 8.8 oz)     Palliative Assessment/Data: 20%     Time In: 3:00 Time Out: 4:30 Time Total: 90 min. Greater than 50%  of this time was spent counseling and coordinating care related to the above assessment and plan.  Signed by: Florentina Jenny, PA-C Palliative Medicine Pager: [phone]  Please contact Palliative Medicine Team phone at [phone] for questions and concerns.  For individual provider: See Shea Evans

## 2017-03-27 NOTE — Progress Notes (Signed)
EKG CRITICAL VALUE     12 lead EKG performed.  Critical value notedJennifer Bishop, RN notified.   [name], [place] 03/27/2017 7:35 AM

## 2017-03-27 NOTE — Progress Notes (Signed)
STROKE TEAM PROGRESS NOTE   SUBJECTIVE (INTERVAL HISTORY) His  wife is at the bedside.  Patient is found laying in bed in NAD, intubated and restraints x 2. Patient opens eyes to voice briskly today with +tracking- briefly, MAE's. POC discussed with wife.  OBJECTIVE Recent Labs  Lab 03/26/17 1710 03/26/17 2001 03/26/17 2329 03/27/17 0357 03/27/17 0720  GLUCAP 136* 105* 121* 120* 113*   Recent Labs  Lab 08-Apr-2017 1305 04-08-2017 2041 03/25/17 0607 03/26/17 0619 03/27/17 0345  NA 132* 134* 135 139 143  K 5.5* 5.0 5.0 3.6 3.1*  CL 97* 100* 101 107 112*  CO2 24 22 21* 22 22  GLUCOSE 128* 185* 139* 121* 131*  BUN 76* 71* 70* 67* 61*  CREATININE 4.24* 3.89* 3.52* 2.95* 2.28*  CALCIUM 9.2 8.7* 8.8* 9.3 9.9  MG  --  2.2 2.1  --   --   PHOS  --  5.0* 4.4  --   --    Recent Labs  Lab 04-08-17 1305 03/26/17 0619  AST 29 18  ALT 29 21  ALKPHOS 138* 99  BILITOT 1.2 0.9  PROT 6.5 6.5  ALBUMIN 2.8* 2.2*   Recent Labs  Lab 04-08-17 1305 03/25/17 0607 03/26/17 0619 03/27/17 0345  WBC 16.5* 16.2* 21.8* 11.5*  NEUTROABS 13.8*  --   --   --   HGB 10.3* 10.3* 9.4* 9.0*  HCT 32.0* 32.0* 29.3* 27.8*  MCV 104.6* 104.2* 103.2* 101.1*  PLT 362 372 467* 308   Recent Labs  Lab Apr 08, 2017 2041 03/25/17 0139 03/25/17 0607  TROPONINI <0.03 <0.03 <0.03   Recent Labs    04/08/17 2041  LABPROT 28.1*  INR 2.66   Recent Labs    04-08-2017 1341  COLORURINE AMBER*  LABSPEC 1.014  PHURINE 5.0  GLUCOSEU NEGATIVE  HGBUR NEGATIVE  BILIRUBINUR NEGATIVE  KETONESUR NEGATIVE  PROTEINUR NEGATIVE  NITRITE NEGATIVE  LEUKOCYTESUR NEGATIVE       Component Value Date/Time   CHOL 92 03/26/2017 0900   TRIG 103 03/26/2017 0900   HDL 29 (L) 03/26/2017 0900   CHOLHDL 3.2 03/26/2017 0900   VLDL 21 03/26/2017 0900   LDLCALC 42 03/26/2017 0900   Lab Results  Component Value Date   HGBA1C 5.6 03/26/2017   No results found for: LABOPIA, COCAINSCRNUR, LABBENZ, AMPHETMU, THCU, LABBARB  No  results for input(s): ETH in the last 168 hours.  IMAGING: I have personally reviewed the radiological images below and agree with the radiology interpretations.  Ct Head Wo Contrast Result Date: 03/26/2017 IMPRESSION: 1. Evolving acute RIGHT cerebellar/posterior-inferior cerebellar artery territory infarct without hemorrhagic conversion. 2. Regional mass effect, no hydrocephalus.   Ct Head Wo Contrast Result Date: 2017-04-08 IMPRESSION: Moderate region of low-attenuation of the inferior aspect of the right cerebellar hemisphere likely acute to subacute infarction, although chronic ischemic changes possible. Consider MRI for further evaluation. Minimal chronic ischemic microvascular disease.   Mr Brain Wo Contrast Result Date: 03/26/2017 IMPRESSION: 1. Acute RIGHT cerebellar infarct with trace petechial hemorrhage. Regional mass effect, no hydrocephalus. 2. Tiny old frontoparietal cortical infarcts. Old small bilateral cerebellar infarcts. 3. Mild chronic small vessel ischemic disease.   ECHO: Study Conclusions - Left ventricle: Assessment of EF and wall motion very limited by   marked septal-lateral wall dyssynchrony from conduction   abnormality. The LVF appears mildly reduced with EF estimated at   40%. The cavity size was mildly dilated. There was mild   concentric hypertrophy. The study was not technically sufficient   to  allow evaluation of LV diastolic dysfunction due to atrial   fibrillation. - Ventricular septum: Septal motion showed severe paradox. These   changes are consistent with intraventricular conduction delay. - Aorta: Aortic root dimension: 42 mm (ED). Ascending aorta   diameter: 45 mm (ED). - Aortic root: The aortic root was mildly dilated. - Ascending aorta: The ascending aorta was mildly dilated. - Atrial septum: There was increased thickness of the septum,   consistent with lipomatous hypertrophy. - Pulmonary arteries: Systolic pressure could not be  accurately   estimated. Impressions: - The findings indicate significant septal-lateral left ventricular   wall dyssynchrony.  Carotid Duplex - No evidence of a significant stenosis in bilateral carotid arteries. Vertebral arteries are patent with antegrade flow  Limited Transcranial Doppler - Completed due to poor acoustic windows normal mean flow velocities in bilateral ophthalmic, carotid and vertebral arteries.   PHYSICAL EXAM Temp:  [97.2 F (36.2 C)-97.8 F (36.6 C)] 97.5 F (36.4 C) (11/14 0718) Pulse Rate:  [26-109] 99 (11/14 0800) Resp:  [13-37] 20 (11/14 0800) BP: (125-163)/(54-81) 163/81 (11/14 0723) SpO2:  [93 %-100 %] 100 % (11/14 0800) Arterial Line BP: (95-165)/(33-81) 158/72 (11/14 0800) FiO2 (%):  [40 %] 40 % (11/14 0800) Weight:  [120 kg (264 lb 8.8 oz)] 120 kg (264 lb 8.8 oz) (11/14 0343)  General - elderly Caucasian male in no apparent distress, appears uncomfortable with ET tube, restrainted x 2 Respiratory - Intubated.  No wheezing. Cardiovascular - Iregular rate and rhythm   Neurological Examination Mental Status: Alert, intubated and only able to follow simple commands. Even then at time he seems to have a hard time figuring out if I want him to push or pull.   Cranial Nerves: II:  Visual fields appear grossly normal III,IV, VI: ptosis not present, extra-ocular motions appear intact bilaterally, pupils equal, round, reactive to light and accommodation V,VII: smile appears symmetric but intubated, facial light touch sensation normal bilaterally VIII: hearing normal bilaterally XI: bilateral shoulder shrug Motor: Moving all extremities antigravity but right > left and more spontaneous on the right Sensory:  light touch intact throughout, bilaterally Deep Tendon Reflexes: 2+ and symmetric throughout Plantars: Right: downgoing                                Left: downgoing Cerebellar: Difficult to assess at this time. All of his movements are jerky and  not fluent.  Gait: deferred  ASSESSMENT AND PLAN: Mr. Earl Barber is a 71 y.o. male with PMH of RA on chronic suppression with monoclonal antibody, AFIB, CHF, CKD, history of Achalasia with recent Food impaction of esophagus  admitted for AMS with hx of a hypoxic event and subsequent stroke seen on CT Head upon admission. Currently intubated.  03/26/17:The patient presented with pneumonia, septic shock and respiratory failure and altered mental status: CT scan which shows an embolic right cerebellar infarct likely from his atrial fibrillation. The patient's prognosis is guarded given his multiple medical problems. Continue treatment for pneumonia and sepsis and ventilatory support. Will need to make decision about anticoagulation later only if patient survives and has reasonable quality of life. Long discussion with the patient's wife and answered questions.   03/27/17: Neuro exam stable. Slightly more alert on exam today. Remains intubated/sedated and restrained. Prognosis continues to be guarded. Multiple failed attempts to place NGT overnight. Long discussion with wife regarding goals of care. Will consult Palliative care to assist with  this. POC discussed with Dr Craige Cotta.  Acute/Subacute Right cerebellar  stroke likely embolic due to atrial fibrillation: off Savaysa since sometime in September  Resultant Symptoms -will not be fully known until patient is extubated and off sedation Stroke Risk Factors - atrial fibrillation, HTN    MRI Head - Acute RIGHT cerebellar infarct with trace petechial hemorrhage.  CT Head - Acute/Subacute Right Cerebellar Infarct  2D Echo - ef 40 % with significant septal-lateral left ventricular wall dyssynchrony.   Carotid Duplex Completed. No evidence of a significant stenosis in bilateral carotid arteries. Vertebral arteries are patent with antegrade flow   Limited Transcranial Doppler Completed due to poor acoustic windows        Recommendations Palliative  care to come today to discuss goals of care with wife   LDL - 42  HgbA1c 5.6  VTE prophylaxis -  Heparin   Diet - Diet NPO time specified   CVA Meds-   aspirin 81 mg daily with Savaysa 60 mg daily  prior to admission,   now on ASA 81 mg daily  Ongoing aggressive stroke risk factor management  Therapy recommendations:  PENDING  Disposition: PENDING  AFIB, CHRONIC: Continue to HOLD Anticoagulant for now, Savaysa. Stopped sometime in September for Right knee swelling and bleeding Continue Heparin SQ On day 5 post stroke may begin Coumadin per pharmacy with heparin bridge, once INR therapeutic may discontinue ASA  ACHALASIA, CHRONIC: Remains NPO Multiple Attempts to put NGT overnight - failed Palliative care consulted to assist wife with goal of care - she is unsure about PEG SLP may need FEES once patient extubated and more alert  HYPERLIPIDEMIA:  Home meds:  NONE  LDL     42 , goal < 70  No statin needed at discharge  Other Stroke Risk Factors:  Advanced age  Obesity, Body mass index is 39.07 kg/m.   CHF  Other Active Problems:  Acute hypoxic respiratory failure  Septic shock secondary to healthcare acquired pneumonia   Elevated troponin I: Suspected cardiac ischemia in the setting of shock and hypoxia  Acute on chronic renal failure: Suspected to be secondary to hypotension, improving  Hyperkalemia  Suspected adrenal insufficiency   Hospital day # 3   Brita Romp Stroke NTeam 03/27/2017 9:53 AM I have personally examined this patient, reviewed notes, independently viewed imaging studies, participated in medical decision making and plan of care.ROS completed by me personally and pertinent positives fully documented  I have made any additions or clarifications directly to the above note. Agree with note above.  Patient has embolic right cerebellar infarct secondary to A. fib. May consider anticoagulation if patient is able to swallow  after extubation in a few days. Discussed with Dr. Craige Cotta This patient is critically ill and at significant risk of neurological worsening, death and care requires constant monitoring of vital signs, hemodynamics,respiratory and cardiac monitoring, extensive review of multiple databases, frequent neurological assessment, discussion with family, other specialists and medical decision making of high complexity.I have made any additions or clarifications directly to the above note.This critical care time does not reflect procedure time, or teaching time or supervisory time of PA/NP/Med Resident etc but could involve care discussion time.  I spent 30 minutes of neurocritical care time  in the care of  this patient.      Delia Heady, MD Medical Director [facility] Pager: [phone] 03/27/2017 12:58 PM  To contact Stroke Continuity provider, please refer to [email or web address]. After hours, contact General Neurology

## 2017-03-27 NOTE — Progress Notes (Signed)
eLink Physician-Brief Progress Note Patient Name: Olney Monier DOB: [birth year] MRN: [id number]   Date of Service  03/27/2017  HPI/Events of Note  Agitation - Request for soft bilateral wrist restraints.   eICU Interventions  Will order bilateral soft wrist restraints.      Intervention Category Minor Interventions: Agitation / anxiety - evaluation and management  Afomia Blackley [name] 03/27/2017, 6:09 AM

## 2017-03-28 DIAGNOSIS — Z66 Do not resuscitate: Secondary | ICD-10-CM

## 2017-03-28 LAB — GLUCOSE, CAPILLARY
GLUCOSE-CAPILLARY: 184 mg/dL — AB (ref 65–99)
GLUCOSE-CAPILLARY: 147 mg/dL — AB (ref 65–99)
GLUCOSE-CAPILLARY: 176 mg/dL — AB (ref 65–99)
Glucose-Capillary: 154 mg/dL — ABNORMAL HIGH (ref 65–99)

## 2017-03-28 LAB — CBC
HEMATOCRIT: 30 % — AB (ref 39.0–52.0)
HEMOGLOBIN: 9.7 g/dL — AB (ref 13.0–17.0)
MCH: 32.6 pg (ref 26.0–34.0)
MCHC: 32.3 g/dL (ref 30.0–36.0)
MCV: 100.7 fL — ABNORMAL HIGH (ref 78.0–100.0)
Platelets: 498 10*3/uL — ABNORMAL HIGH (ref 150–400)
RBC: 2.98 MIL/uL — AB (ref 4.22–5.81)
RDW: 15 % (ref 11.5–15.5)
WBC: 28.1 10*3/uL — ABNORMAL HIGH (ref 4.0–10.5)

## 2017-03-28 LAB — BASIC METABOLIC PANEL
ANION GAP: 14 (ref 5–15)
BUN: 66 mg/dL — ABNORMAL HIGH (ref 6–20)
CO2: 16 mmol/L — ABNORMAL LOW (ref 22–32)
Calcium: 10.7 mg/dL — ABNORMAL HIGH (ref 8.9–10.3)
Chloride: 116 mmol/L — ABNORMAL HIGH (ref 101–111)
Creatinine, Ser: 2.26 mg/dL — ABNORMAL HIGH (ref 0.61–1.24)
GFR calc Af Amer: 31 mL/min — ABNORMAL LOW (ref >60)
GFR, EST NON AFRICAN AMERICAN: 27 mL/min — AB (ref >60)
Glucose, Bld: 205 mg/dL — ABNORMAL HIGH (ref 65–99)
POTASSIUM: 3.6 mmol/L (ref 3.5–5.1)
SODIUM: 146 mmol/L — AB (ref 135–145)

## 2017-03-28 MED ORDER — CHLORHEXIDINE GLUCONATE 0.12 % MT SOLN
15.0000 mL | Freq: Two times a day (BID) | OROMUCOSAL | Status: DC
  Administered 2017-03-28: 15 mL via OROMUCOSAL
  Filled 2017-03-28: qty 15

## 2017-03-28 MED ORDER — FUROSEMIDE 10 MG/ML IJ SOLN
60.0000 mg | Freq: Once | INTRAMUSCULAR | Status: AC
  Administered 2017-03-28: 60 mg via INTRAVENOUS
  Filled 2017-03-28: qty 6

## 2017-03-28 MED ORDER — SODIUM CHLORIDE 0.9 % IV SOLN
0.0000 ug/min | INTRAVENOUS | Status: DC
  Administered 2017-03-28 (×2): 10 ug/min via INTRAVENOUS
  Filled 2017-03-28: qty 1

## 2017-03-28 MED ORDER — FENTANYL CITRATE (PF) 100 MCG/2ML IJ SOLN: 25.0000 ug | INTRAMUSCULAR | Status: DC | PRN

## 2017-03-28 MED ORDER — ORAL CARE MOUTH RINSE: 15.0000 mL | Freq: Two times a day (BID) | OROMUCOSAL | Status: DC

## 2017-03-28 MED ORDER — MORPHINE SULFATE (PF) 2 MG/ML IV SOLN
1.0000 mg | INTRAVENOUS | Status: DC | PRN
  Administered 2017-03-28 – 2017-03-29 (×13): 2 mg via INTRAVENOUS
  Filled 2017-03-28 (×13): qty 1

## 2017-03-28 MED ORDER — DEXMEDETOMIDINE HCL IN NACL 400 MCG/100ML IV SOLN
0.4000 ug/kg/h | INTRAVENOUS | Status: DC
  Administered 2017-03-28: 0.8 ug/kg/h via INTRAVENOUS
  Administered 2017-03-28 (×2): 1 ug/kg/h via INTRAVENOUS
  Administered 2017-03-28: 0.6 ug/kg/h via INTRAVENOUS
  Administered 2017-03-28 – 2017-03-29 (×2): 1.2 ug/kg/h via INTRAVENOUS
  Administered 2017-03-29 (×2): 1.1 ug/kg/h via INTRAVENOUS
  Filled 2017-03-28 (×9): qty 100

## 2017-03-28 NOTE — Progress Notes (Signed)
Daily Progress Note   Patient Name: Earl Barber       Date: 03/28/2017 DOB: 09-21-1945  Age: 71 y.o. MRN#: [id number] Attending Physician: Javier Glazier, MD Primary Care Physician: Patient, No Pcp Per Admit Date: 03/29/2017  Reason for Consultation/Follow-up: Establishing goals of care  Subjective: Sat with wife in the [place].  We discussed the fact that her husband is dying.  She and Dr. Halford Chessman had already discussed full DNR. It comforted the wife that there are no more medical decisions for her family to make.  She understands the patients body is dying.  The plan is to shift to FULL COMFORT after the daughter arrives to see her father.  Wife understands that despite Bipap and pressors her husband may still pass away before a shift to full comfort is made.  She is ok with this.  The patient has a history of opioid dependence and will likely require a relatively strong gtt with boluses for comfort.  He has a history of hallucinations/agitation/severe withdraw symptoms.   Assessment: 71 yo male with multiple medical problems admitted with acute stroke.  He is declining and likely will not last long once Bipap and pressors are removed.   Patient Profile/HPI: 71 y.o. male  with past medical history of achalasia with recurrent esophageal obstructions, CHF (EF 40% with septal dyssynchrony) Afib, CKD, RA who was admitted on 03/31/2017 with septic shock secondary to HCAP.  He was noted to have acute encephalopathy.  MR brain reveals multiple small previous strokes as well as an acute CVA (right cerebellar CVA with regional mass effect and small hemorrhage).  He required intubation, but was extubated 11/14.  Length of Stay: 4  Current Medications: Scheduled Meds:  . chlorhexidine gluconate  (MEDLINE KIT)  15 mL Mouth Rinse BID  . Chlorhexidine Gluconate Cloth  6 each Topical Daily  . hydrocortisone sod succinate (SOLU-CORTEF) inj  50 mg Intravenous Q6H  . mouth rinse  15 mL Mouth Rinse QID  . sodium chloride flush  10-40 mL Intracatheter Q12H    Continuous Infusions: . sodium chloride 250 mL (03/25/17 0700)  .  ceFAZolin (ANCEF) IV Stopped (03/28/17 1136)  . dexmedetomidine (PRECEDEX) IV infusion 0.6 mcg/kg/hr (03/28/17 1200)  . phenylephrine (NEO-SYNEPHRINE) Adult infusion 20 mcg/min (03/28/17 1100)    PRN Meds: sodium chloride, fentaNYL (SUBLIMAZE)  injection, morphine injection, sodium chloride flush  Physical Exam        Well developed male, appears critically ill.  On bipap, sedated.  Hands in mitts. Increased work of breathing.  Vital Signs: BP 107/72   Pulse (!) 47   Temp 97.6 F (36.4 C) (Axillary)   Resp 20   Ht 5' 9" (1.753 m)   Wt 119 kg (262 lb 5.6 oz)   SpO2 100%   BMI 38.74 kg/m  SpO2: SpO2: 100 % O2 Device: O2 Device: Bi-PAP O2 Flow Rate: O2 Flow Rate (L/min): 2 L/min  Intake/output summary:   Intake/Output Summary (Last 24 hours) at 03/28/2017 1254 Last data filed at 03/28/2017 1200 Gross per 24 hour  Intake 1128.3 ml  Output 1525 ml  Net -396.7 ml   LBM: Last BM Date: 03/23/17 Baseline Weight: Weight: 99.8 kg (220 lb) Most recent weight: Weight: 119 kg (262 lb 5.6 oz)       Palliative Assessment/Data:10%      Patient Active Problem List   Diagnosis Date Noted  . Palliative care encounter   . Goals of care, counseling/discussion   . Cerebellar stroke ([place]) 03/26/2017  . Acute respiratory failure with hypoxia ([place])   . Encounter for central line placement   . Pressure injury of skin 03/25/2017  . HCAP (healthcare-associated pneumonia) 04/04/2017    Palliative Care Plan    Recommendations/Plan:  After daughter visits d/c pressors and bipap and shift to full comfort with a fairly strong opioid gtt with boluses.  PMT is  available for assistance if needed.   Please call our office if we can help [phone]  Goals of Care and Additional Recommendations:  Limitations on Scope of Treatment: Full Comfort Care after Daughter visits.  Code Status:  DNR  Prognosis:   Hours - Days   Discharge Planning:  Anticipated Hospital Death  Care plan was discussed with wife, attending physician, bedside RN  Thank you for allowing the Palliative Medicine Team to assist in the care of this patient.  Total time spent:  35 min.     Greater than 50%  of this time was spent counseling and coordinating care related to the above assessment and plan.  Florentina Jenny, PA-C Palliative Medicine  Please contact Palliative MedicineTeam phone at [phone] for questions and concerns between 7 am - 7 pm.   Please see AMION for individual provider pager numbers.

## 2017-03-28 NOTE — Progress Notes (Signed)
eLink Physician-Brief Progress Note Patient Name: Earl Barber DOB: 1945/07/02 MRN: [id number]   Date of Service  03/28/2017  HPI/Events of Note  Patient is DNR/DNI, on biPAP, increased WOB very agitated   eICU Interventions  On precedex, morphine as needed, on neo, will give lasix once and day team to assess resp status, if fails then may need to transition to Comfort care measures.      Intervention Category Evaluation Type: Other  Kelia Gibbon 03/28/2017, 6:24 AM

## 2017-03-28 NOTE — Progress Notes (Signed)
PULMONARY / CRITICAL CARE MEDICINE   Name: Earl Barber MRN: [id number] DOB: 15-Sep-1945    ADMISSION DATE:  03/15/2017 CONSULTATION DATE:  03/18/2017  REFERRING MD:  Earl Barber, M.D. / EDP  CHIEF COMPLAINT:  Acute hypoxic respiratory failure  HISTORY OF PRESENT ILLNESS:  History obtained from the emergency department provider as well as the electronic medical record and wife with patient currently intubated. 71 y.o. male with history of chronic renal failure, rheumatoid arthritis, morbid obesity, and atrial fibrillation. Presenting with altered mental status. Currently residing at a local skilled nursing facility. Patient was in his normal state of health approximately 2 days ago oriented and conversational. Over the weekend experienced progressive decline in mental status with noted delusions. Episodes of hypoxia requiring supplemental oxygen were also noted. Staff have not noticed any fever, vomiting, or other illnesses. In the emergency room the patient's respiratory status progressively deteriorated and oxygen requirement continued to increase. In the setting of his continued altered and worsening mentation he was endotracheally intubated. Initially the patient was administered Lasix thinking this could represent a congestive heart failure exacerbation but with the higher probability of healthcare associated pneumonia sepsis protocol was initiated. Patient reportedly hospitalized in October for food impaction despite previous Botox administration for achalasia. Patient was transitioned to rehabilitation last Wednesday. On Friday evening his wife noticed that he was more confused. She reports decreased intake of his usual sustenance: Soups. She denies any aspiration events. Staff did note cough productive of a yellow mucus. He did previously have his influenza vaccine. He is maintained on prednisone 5 mg daily in addition to his monoclonal antibody for treatment of his underlying rheumatoid  arthritis. The patient is also on systemic anticoagulation for his atrial fibrillation.  PAST MEDICAL HISTORY :  He  has a past medical history of Achalasia, Atrial fibrillation ([place]), CHF (congestive heart failure) ([place]), Chronic renal failure, Food impaction of esophagus, and Rheumatoid arthritis ([place]).  PAST SURGICAL HISTORY: He  has a past surgical history that includes Ankle fracture surgery (1978); Esophagogastroduodenoscopy; and Esophagoscopy w/botox injection (01/03/2017).  Allergies  Allergen Reactions  . Bupropion Other (See Comments)    unspecified  . Pilocarpine Other (See Comments)    unspecified  . Prevacid [Lansoprazole] Other (See Comments)    unspecified  . Sulfasalazine Other (See Comments)    unspecified  . Sulfamethoxazole Rash    No current facility-administered medications on file prior to encounter.    Current Outpatient Medications on File Prior to Encounter  Medication Sig  . alfuzosin (UROXATRAL) 10 MG 24 hr tablet Take 10 mg daily with breakfast by mouth.  Marland Kitchen allopurinol (ZYLOPRIM) 300 MG tablet Take 300 mg at bedtime by mouth.  . ARTIFICIAL TEAR OINTMENT OP Place 1 application daily as needed into both eyes (dry eyes).  Marland Kitchen aspirin 81 MG chewable tablet Chew 81 mg daily by mouth.  . calcitRIOL (ROCALTROL) 0.25 MCG capsule Take 0.25 mcg every Monday, Wednesday, and Friday by mouth.  . cyclobenzaprine (FLEXERIL) 5 MG tablet Take 5 mg 2 (two) times daily as needed by mouth for muscle spasms.  . diclofenac sodium (VOLTAREN) 1 % GEL Apply 4 g 4 (four) times daily as needed topically (pain).  Marland Kitchen edoxaban (SAVAYSA) 60 MG TABS tablet Take 60 mg daily by mouth.  . ferrous sulfate 325 (65 FE) MG tablet Take 325 mg daily with breakfast by mouth.  . furosemide (LASIX) 40 MG tablet Take 40 mg daily by mouth.  . gabapentin (NEURONTIN) 100 MG capsule Take 100  mg at bedtime by mouth.  Marland Kitchen HYDROcodone-acetaminophen (NORCO/VICODIN) 5-325 MG tablet Take 1 tablet every 6 (six)  hours as needed by mouth for moderate pain.  Marland Kitchen lisinopril (PRINIVIL,ZESTRIL) 5 MG tablet Take 5 mg daily by mouth.  . metoprolol tartrate (LOPRESSOR) 12.5 mg TABS tablet Take 12.5 mg 2 (two) times daily by mouth.  . morphine (MSIR) 15 MG tablet Take 30-45 mg See admin instructions by mouth. 6m by mouth twice daily, 464mat bedtime  . ondansetron (ZOFRAN) 4 MG tablet Take 4 mg every 8 (eight) hours by mouth.  . pantoprazole (PROTONIX) 40 MG tablet Take 40 mg 2 (two) times daily by mouth.  . polyethylene glycol (MIRALAX / GLYCOLAX) packet Take 17 g daily by mouth.  . predniSONE (DELTASONE) 5 MG tablet Take 5 mg daily with breakfast by mouth.  . ranitidine (ZANTAC) 150 MG tablet Take 150 mg 2 (two) times daily by mouth.  . sennosides-docusate sodium (SENOKOT-S) 8.6-50 MG tablet Take 1 tablet 2 (two) times daily by mouth.  . Tocilizumab 162 MG/0.9ML SOSY Inject 0.9 mLs once a week into the skin.  . Marland Kitchenenlafaxine XR (EFFEXOR-XR) 150 MG 24 hr capsule Take 150 mg daily with breakfast by mouth.  . vitamin C (ASCORBIC ACID) 500 MG tablet Take 500 mg daily by mouth.    FAMILY HISTORY:  His indicated that the status of his mother is unknown. He indicated that the status of his brother is unknown.   SOCIAL HISTORY: He  reports that he quit smoking about 35 years ago. He started smoking about 67 years ago. He has a 56.00 pack-year smoking history. he has never used smokeless tobacco. He reports that he does not drink alcohol or use drugs.  REVIEW OF SYSTEMS:   Patient intubated but able to endorse generalized malaise, denies chest pain or nausea.  SUBJECTIVE:  Afebrile with stable vitals.  Agitated overnight requiring soft restraints.  Tolerating full vent support without need for pressors.  VITAL SIGNS: BP (!) 79/57 (BP Location: Left Arm)   Pulse 78   Temp 98.4 F (36.9 C) (Axillary)   Resp 19   Ht 5' 9"  (1.753 m)   Wt 262 lb 5.6 oz (119 kg)   SpO2 98%   BMI 38.74 kg/m    HEMODYNAMICS: CVP:  [18 mmHg] 18 mmHg  VENTILATOR SETTINGS: Vent Mode: BIPAP FiO2 (%):  [40 %-50 %] 40 % Set Rate:  [12 bmp-16 bmp] 12 bmp Vt Set:  [570 mL] 570 mL PEEP:  [5 cmH20-6 cmH20] 6 cmH20 Plateau Pressure:  [17 cmH20] 17 cmH20  INTAKE / OUTPUT: I/O last 3 completed shifts: In: 1232.3 [I.V.:844.8; IV Piggyback:387.5] Out: 2245 [Urine:2245] UOP 1.615 L last 24 hours  PHYSICAL EXAMINATION: General: ill-appearing male lying in bed, NAD with non-toxic appearance HEENT: normocephalic, atraumatic, moist mucous membranes, PERRLA Neck: supple, non-tender without lymphadenopathy Cardiovascular: regular rate and rhythm without murmurs, rubs, or gallops Lungs: coarse breath sounds bilaterally with normal work of breathing on ventilator Abdomen: obese, soft, non-tender, non-distended, normoactive bowel sounds Skin: warm, dry, no rashes or lesions, cap refill < 2 seconds Extremities: warm and well perfused, normal tone, no edema Neuro: Sedated while intubated, gross motor function intact in all 4 extremities Psych: Minimally agitated but able to reorient  LABS:  BMET Recent Labs  Lab 03/26/17 0619 03/27/17 0345 03/28/17 0510  NA 139 143 146*  K 3.6 3.1* 3.6  CL 107 112* 116*  CO2 22 22 16*  BUN 67* 61* 66*  CREATININE 2.95*  2.28* 2.26*  GLUCOSE 121* 131* 205*    Electrolytes Recent Labs  Lab 04/09/2017 2041 03/25/17 0607 03/26/17 0619 03/27/17 0345 03/28/17 0510  CALCIUM 8.7* 8.8* 9.3 9.9 10.7*  MG 2.2 2.1  --   --   --   PHOS 5.0* 4.4  --   --   --     CBC Recent Labs  Lab 03/26/17 0619 03/27/17 0345 03/28/17 0510  WBC 21.8* 11.5* 28.1*  HGB 9.4* 9.0* 9.7*  HCT 29.3* 27.8* 30.0*  PLT 467* 308 498*    Coag's Recent Labs  Lab 04/12/2017 2041  APTT 51*  INR 2.66    Sepsis Markers Recent Labs  Lab 04/11/2017 2041 03/25/17 0138 03/25/17 0607 03/26/17 0619  LATICACIDVEN 1.3 1.1 1.5  --   PROCALCITON 0.66  --  0.78 0.58    ABG Recent Labs   Lab 03/18/2017 1717 03/25/17 0347 03/25/17 0601  PHART 7.296* 7.279* 7.338*  PCO2ART 53.8* 46.5 39.9  PO2ART 146.0* 70.4* 67.0*    Liver Enzymes Recent Labs  Lab 03/18/2017 1305 03/26/17 0619  AST 29 18  ALT 29 21  ALKPHOS 138* 99  BILITOT 1.2 0.9  ALBUMIN 2.8* 2.2*    Cardiac Enzymes Recent Labs  Lab 03/21/2017 2041 03/25/17 0139 03/25/17 0607  TROPONINI <0.03 <0.03 <0.03    Glucose Recent Labs  Lab 03/27/17 1133 03/27/17 1523 03/27/17 1952 03/28/17 0055 03/28/17 0523 03/28/17 0836  GLUCAP 184* 126* 149* 154* 184* 147*    Imaging No results found.   STUDIES:  CXR 11/11 >> mild perihilar and bibasilar opacification likely interstitial edema, less likely infectious CT head WO 11/11 >> moderate region of low attenuation in the inferior aspect of right cerebellar hemisphere likely acute to subacute infarction, although chronic ischemia changes possible, recommending MRI, minimal chronic ischemic microvascular disease evident CXR 11/11 >> stable ETT, low lung volumes, stable mild cardiomegaly, stable linear and hazy perihilar and bibasilar lung opacities favoring mild pulmonary edema and atelectasis CXR 11/12 >> stable ETT, stable cardiomegaly with increased bilateral edema, left greater than right pleural effusion, bibasilar airspace disease likely representing atelectasis in CHF, less likely infectious CXR 11/12 >> LIJ stable, no pneumothorax, cardiomegaly without edema, probable small L-pleural effusion MRI brain WO 11/13 >> acute right cerebellar infarct with trace petecial hemorrahe, region mass effect, no hydrocephalus CT head WO 11/13 >> evolving right cerebellar/posterior-inferior cerebellar artery territory infarct without hemorrhagic conversion, regional mass effect, no hydrocephalus CXR 11/14 >> support apparatus stable, stable cardiomegaly, edema, and bilateral effusions compatible with heart failure, stable slight increase in bilateral bibasilar airspace  disease likely atelectasis with infection less likely  CULTURES: Respiratory culture 11/12 >> abundant WBC, predominantly PMNs, abundant GPC, culture pending Blood culture 11/11 >> NGD2 Urine culture 11/11 >> NGTD RVP PCR 11/11 >> NGTD  ANTIBIOTICS: Azithromycin 11/11-11/12 Vancomycin IV 11/11-11/14 Zosyn IV 11/11-11/14 Cefazolin 11/14 >>  SIGNIFICANT EVENTS: 11/11 >> admit for AMS requiring intubation 11/12 >> phenylephrine gtt started for hypotension 11/13 >> MRI confirmed right cerebellar infract with regional mass effect  LINES/TUBES: ETT 11/11 >> Arterial line R-radial 11/12 >> LIJ triple lumen 11/13 >> PIV L-hand 11/11 >> PIV L-wrist 11/11-11/12 PIV L-forearm 11/11 >> Urethral catheter 11/11 >>  DISCUSSION: Patient is a 71 year old male on chronic suppression with monoclonal antibody for rheumatoid arthritis as well as prednisone 5 mg daily and known achalasia with repetitive food impaction presenting with acute encephalopathy and acute hypoxic respiratory failure with hypotension suspected to be secondary to HAP requiring intubation and  pressor support.  ASSESSMENT / PLAN:  PULMONARY Acute hypoxic respiratory failure: off vent 11/14, now DNR but okay for BiPAP short-term - Treatment for HAP per below - Continuous pulse ox  - Oral hygiene   CARDIOVASCULAR Elevated troponin I: Suspected cardiac ischemia in the setting of shock and hypoxia Atrial fibrillation Hypotension - Telemetry - Daily weights and I&O - Holding home Savaysa given acute stroke, continue daily aspirin 81 mg - Neo gtt  RENAL   Acute on chronic renal failure: Suspected to be secondary to hypotension, improving Hyperkalemia: Resolved Hypernatremia - Continuing fluid resuscitation - Avoid nephrotoxic agents - Trend electrolytes daily - Foley cath, daily weight, I&O - Replete electrolytes as indicated - Telemetry - KVO  GASTROINTESTINAL Nutrition Achalasia on liquid diet at home -  Protonix IV for SUP - NPO  HEMATOLOGIC Anemia: Stable without signs of active bleeding Coagulopathy with chronic oral anticoagulation - Trending CBC, INR and PTT - Holding home Savaysa given acute stroke, continue daily aspirin 81 mg - Heparin SQ  INFECTIOUS   Septic shock secondary to healthcare acquired pneumonia  - Starting day 2 cefazolin s/p 4 days vancomycin+Zosyn - Trend fever curve, WBC, procalcitonin - Droplet precautions  ENDOCRINE Suspected adrenal insufficiency Chronic glucose corticoid use  - Continue stress dose steroids with hydrocortisone 50 mg IV every 6 hours  NEUROLOGIC  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

## 2017-03-28 NOTE — Progress Notes (Signed)
STROKE TEAM PROGRESS NOTE   SUBJECTIVE (INTERVAL HISTORY) His  wife is at the bedside.  Patient is found laying in bed in NAD, extubated yesterday, now on Bipap. Patient opens eyes briefly, much more sleepy today. POC discussed with wife. Palliative care consulted yesterday.  OBJECTIVE Recent Labs  Lab 03/27/17 1952 03/28/17 0055 03/28/17 0523 03/28/17 0836 03/28/17 1057  GLUCAP 149* 154* 184* 147* 176*   Recent Labs  Lab 08/23/2016 2041 03/25/17 0607 03/26/17 0619 03/27/17 0345 03/28/17 0510  NA 134* 135 139 143 146*  K 5.0 5.0 3.6 3.1* 3.6  CL 100* 101 107 112* 116*  CO2 22 21* 22 22 16*  GLUCOSE 185* 139* 121* 131* 205*  BUN 71* 70* 67* 61* 66*  CREATININE 3.89* 3.52* 2.95* 2.28* 2.26*  CALCIUM 8.7* 8.8* 9.3 9.9 10.7*  MG 2.2 2.1  --   --   --   PHOS 5.0* 4.4  --   --   --    Recent Labs  Lab 08/23/2016 1305 03/26/17 0619  AST 29 18  ALT 29 21  ALKPHOS 138* 99  BILITOT 1.2 0.9  PROT 6.5 6.5  ALBUMIN 2.8* 2.2*   Recent Labs  Lab 08/23/2016 1305 03/25/17 0607 03/26/17 0619 03/27/17 0345 03/28/17 0510  WBC 16.5* 16.2* 21.8* 11.5* 28.1*  NEUTROABS 13.8*  --   --   --   --   HGB 10.3* 10.3* 9.4* 9.0* 9.7*  HCT 32.0* 32.0* 29.3* 27.8* 30.0*  MCV 104.6* 104.2* 103.2* 101.1* 100.7*  PLT 362 372 467* 308 498*   Recent Labs  Lab 08/23/2016 2041 03/25/17 0139 03/25/17 0607  TROPONINI <0.03 <0.03 <0.03   No results for input(s): LABPROT, INR in the last 72 hours. No results for input(s): COLORURINE, LABSPEC, PHURINE, GLUCOSEU, HGBUR, BILIRUBINUR, KETONESUR, PROTEINUR, UROBILINOGEN, NITRITE, LEUKOCYTESUR in the last 72 hours.  Invalid input(s): APPERANCEUR     Component Value Date/Time   CHOL 92 03/26/2017 0900   TRIG 103 03/26/2017 0900   HDL 29 (L) 03/26/2017 0900   CHOLHDL 3.2 03/26/2017 0900   VLDL 21 03/26/2017 0900   LDLCALC 42 03/26/2017 0900   Lab Results  Component Value Date   HGBA1C 5.6 03/26/2017   No results found for: LABOPIA,  COCAINSCRNUR, LABBENZ, AMPHETMU, THCU, LABBARB  No results for input(s): ETH in the last 168 hours.  IMAGING: I have personally reviewed the radiological images below and agree with the radiology interpretations.  Ct Head Wo Contrast Result Date: 03/26/2017 IMPRESSION: 1. Evolving acute RIGHT cerebellar/posterior-inferior cerebellar artery territory infarct without hemorrhagic conversion. 2. Regional mass effect, no hydrocephalus.   Ct Head Wo Contrast Result Date: March 20, 2017 IMPRESSION: Moderate region of low-attenuation of the inferior aspect of the right cerebellar hemisphere likely acute to subacute infarction, although chronic ischemic changes possible. Consider MRI for further evaluation. Minimal chronic ischemic microvascular disease.   Mr Brain Wo Contrast Result Date: 03/26/2017 IMPRESSION: 1. Acute RIGHT cerebellar infarct with trace petechial hemorrhage. Regional mass effect, no hydrocephalus. 2. Tiny old frontoparietal cortical infarcts. Old small bilateral cerebellar infarcts. 3. Mild chronic small vessel ischemic disease.   ECHO: Study Conclusions - Left ventricle: Assessment of EF and wall motion very limited by   marked septal-lateral wall dyssynchrony from conduction   abnormality. The LVF appears mildly reduced with EF estimated at   40%. The cavity size was mildly dilated. There was mild   concentric hypertrophy. The study was not technically sufficient   to allow evaluation of LV diastolic dysfunction due to  atrial   fibrillation. - Ventricular septum: Septal motion showed severe paradox. These   changes are consistent with intraventricular conduction delay. - Aorta: Aortic root dimension: 42 mm (ED). Ascending aorta   diameter: 45 mm (ED). - Aortic root: The aortic root was mildly dilated. - Ascending aorta: The ascending aorta was mildly dilated. - Atrial septum: There was increased thickness of the septum,   consistent with lipomatous hypertrophy. -  Pulmonary arteries: Systolic pressure could not be accurately   estimated. Impressions: - The findings indicate significant septal-lateral left ventricular   wall dyssynchrony.  Carotid Duplex - No evidence of a significant stenosis in bilateral carotid arteries. Vertebral arteries are patent with antegrade flow  Limited Transcranial Doppler - Completed due to poor acoustic windows normal mean flow velocities in bilateral ophthalmic, carotid and vertebral arteries.   PHYSICAL EXAM Temp:  [97 F (36.1 C)-98.4 F (36.9 C)] 97.6 F (36.4 C) (11/15 1058) Pulse Rate:  [37-138] 47 (11/15 1200) Resp:  [16-41] 20 (11/15 1200) BP: (79-131)/(49-94) 107/72 (11/15 1000) SpO2:  [93 %-100 %] 100 % (11/15 1200) Arterial Line BP: (78-152)/(46-84) 104/57 (11/15 1200) FiO2 (%):  [40 %] 40 % (11/15 1200) Weight:  [119 kg (262 lb 5.6 oz)] 119 kg (262 lb 5.6 oz) (11/15 0500)  General - elderly Caucasian male in no apparent distress, appears uncomfortable with ET tube, restrainted x 2 Respiratory - Intubated.  No wheezing. Cardiovascular - Iregular rate and rhythm   Neurological Examination Mental Status: Alert, intubated and only able to follow simple commands. Even then at time he seems to have a hard time figuring out if I want him to push or pull.   Cranial Nerves: II:  Visual fields appear grossly normal III,IV, VI: ptosis not present, extra-ocular motions appear intact bilaterally, pupils equal, round, reactive to light and accommodation V,VII: smile appears symmetric but intubated, facial light touch sensation normal bilaterally VIII: hearing normal bilaterally XI: bilateral shoulder shrug Motor: Moving all extremities antigravity but right > left and more spontaneous on the right Sensory:  light touch intact throughout, bilaterally Deep Tendon Reflexes: 2+ and symmetric throughout Plantars: Right: downgoing                                Left: downgoing Cerebellar: Difficult to assess  at this time. All of his movements are jerky and not fluent.  Gait: deferred  ASSESSMENT AND PLAN: Mr. Earl Barber is a 71 y.o. male with PMH of RA on chronic suppression with monoclonal antibody, AFIB, CHF, CKD, history of Achalasia with recent Food impaction of esophagus  admitted for AMS with hx of a hypoxic event and subsequent stroke seen on CT Head upon admission. Currently intubated.  03/26/17:The patient presented with pneumonia, septic shock and respiratory failure and altered mental status: CT scan which shows an embolic right cerebellar infarct likely from his atrial fibrillation. The patient's prognosis is guarded given his multiple medical problems. Continue treatment for pneumonia and sepsis and ventilatory support. Will need to make decision about anticoagulation later only if patient survives and has reasonable quality of life. Long discussion with the patient's wife and answered questions.   03/27/17: Neuro exam stable. Slightly more alert on exam today. Remains intubated/sedated and restrained. Prognosis continues to be guarded. Multiple failed attempts to place NGT overnight. Long discussion with wife regarding goals of care. Will consult Palliative care to assist with this. POC discussed with Dr Craige Cotta.  03/28/17:  More sleepy on exam and not following any commands today. Extubated overnight and now on Bipap. Palliative care assisted wife with goals of care. Awaiting daughters arrival tonight and then likely to transition to comfort care. Continues to have poor prognosis.  Acute/Subacute Right cerebellar  stroke likely embolic due to atrial fibrillation: off Savaysa since sometime in September  Resultant Symptoms -will not be fully known until patient is extubated and off sedation Stroke Risk Factors - atrial fibrillation, HTN    MRI Head - Acute RIGHT cerebellar infarct with trace petechial hemorrhage.  CT Head - Acute/Subacute Right Cerebellar Infarct  2D Echo - ef 40 % with  significant septal-lateral left ventricular wall dyssynchrony.   Carotid Duplex Completed. No evidence of a significant stenosis in bilateral carotid arteries. Vertebral arteries are patent with antegrade flow   Limited Transcranial Doppler Completed due to poor acoustic windows        Recommendations Palliative care to come today to discuss goals of care with wife   LDL - 42  HgbA1c 5.6  VTE prophylaxis -  Heparin   Diet - Diet NPO time specified   CVA Meds-   aspirin 81 mg daily with Savaysa 60 mg daily  prior to admission,   now on ASA 81 mg daily  Ongoing aggressive stroke risk factor management  Therapy recommendations:  PENDING  Disposition: Likely transition to comfort care  AFIB, CHRONIC: Continue to HOLD Anticoagulant for now, Savaysa. Stopped sometime in September for Right knee swelling and bleeding Continue Heparin SQ On day 5 post stroke may begin Coumadin per pharmacy with heparin bridge, once INR therapeutic may discontinue ASA, if applicable  ACHALASIA, CHRONIC: Remains NPO Multiple Attempts to put NGT overnight - failed Palliative care consulted to assist wife with goal of care - No PEG  HYPERLIPIDEMIA:  Home meds:  NONE  LDL     42 , goal < 70  No statin needed at discharge  Other Stroke Risk Factors:  Advanced age  Obesity, Body mass index is 38.74 kg/m.   CHF  Other Active Problems:  Acute hypoxic respiratory failure  Septic shock secondary to healthcare acquired pneumonia   Elevated troponin I: Suspected cardiac ischemia in the setting of shock and hypoxia  Acute on chronic renal failure: Suspected to be secondary to hypotension, improving  Hyperkalemia  Suspected adrenal insufficiency   Hospital day # 4   Brita Romp Stroke NTeam 03/28/2017 12:44 PM  I have personally examined this patient, reviewed notes, independently viewed imaging studies, participated in medical decision making and plan of care.ROS  completed by me personally and pertinent positives fully documented  I have made any additions or clarifications directly to the above note. Agree with note above. Long discussion the bedside with patient's wife and with Dr. Craige Cotta and agree with palliative care and comfort care. Stroke team will sign off. Kindly call for questions. This patient is critically ill and at significant risk of neurological worsening, death and care requires constant monitoring of vital signs, hemodynamics,respiratory and cardiac monitoring, extensive review of multiple databases, frequent neurological assessment, discussion with family, other specialists and medical decision making of high complexity.I have made any additions or clarifications directly to the above note.This critical care time does not reflect procedure time, or teaching time or supervisory time of PA/NP/Med Resident etc but could involve care discussion time.  I spent 30 minutes of neurocritical care time  in the care of  this patient.     Delia Heady, MD  Medical Director [facility] Pager: [phone] 03/28/2017 2:15 PM  Neurology to sign off at this time. Please call with any further questions or concerns. Thank you for this consultation  To contact Stroke Continuity provider, please refer to [email or web address]. After hours, contact General Neurology

## 2017-03-29 LAB — CULTURE, BLOOD (ROUTINE X 2)
CULTURE: NO GROWTH
Culture: NO GROWTH
SPECIAL REQUESTS: ADEQUATE

## 2017-03-29 MED ORDER — FENTANYL 2500MCG IN NS 250ML (10MCG/ML) PREMIX INFUSION
100.0000 ug/h | INTRAVENOUS | Status: DC
  Administered 2017-03-29 – 2017-03-30 (×2): 100 ug/h via INTRAVENOUS
  Filled 2017-03-29 (×2): qty 250

## 2017-03-29 MED ORDER — LORAZEPAM 2 MG/ML IJ SOLN
5.0000 mg/h | INTRAVENOUS | Status: DC
  Administered 2017-03-29 – 2017-03-30 (×3): 5 mg/h via INTRAVENOUS
  Filled 2017-03-29 (×3): qty 25

## 2017-03-29 MED ORDER — LORAZEPAM BOLUS VIA INFUSION
2.0000 mg | INTRAVENOUS | Status: DC | PRN
  Filled 2017-03-29: qty 5

## 2017-03-29 MED ORDER — ACETAMINOPHEN 650 MG RE SUPP: 650.0000 mg | RECTAL | Status: DC | PRN

## 2017-03-29 MED ORDER — FENTANYL BOLUS VIA INFUSION
50.0000 ug | INTRAVENOUS | Status: DC | PRN
  Filled 2017-03-29: qty 200

## 2017-03-29 NOTE — Progress Notes (Signed)
Pt taken off Bipap and placed on room air per orders.  Family and RN in pt's room.

## 2017-03-29 NOTE — Progress Notes (Addendum)
CSW spoke with pt's RN and was informed that this will likely be a hospital death.CSW spoke with pt's wife and daughter at bedside, and they confimred that at this time they are doing okay. CSW will allow pt's family time with pt. At this time there are no further CSW needs. CSW signing off. If new need arises please reconsult.      Earl Barber, MSW, LCSW-A Emergency Department Clinical Social Worker [phone]

## 2017-03-29 NOTE — Progress Notes (Signed)
Visited with this patient and family via Spiritual Care Consult.  Patient is resting well and have proceeded to comfort care measures.  Daughter and spouse at bedside and are sharing great stories with me of their loved one.  Daughter lives in [place] and is appreciative of all of the medical support her dad has received.  The spouse says the care her husband has received has been exceptional.  Thank you to the medical team for taking such good care of this patient.  Chaplain offer compassionate presence and prayer with family and patient.   03/29/17 1403  Clinical Encounter Type  Visited With Patient and family together  Visit Type Initial;Psychological support;Spiritual support;Social support;Patient actively dying;Critical Care  Referral From Physician  Spiritual Encounters  Spiritual Needs Prayer;Emotional;Grief support

## 2017-03-29 NOTE — Progress Notes (Signed)
Daughter and Wife at bedside. Family requests that shift to full comfort care be done tomorrow morning when they can return rested. Pt is calm and resting, receiving morphine prn and precedex gtt.  Will continue to monitor.

## 2017-03-29 NOTE — Progress Notes (Signed)
PULMONARY / CRITICAL CARE MEDICINE   Name: Tymel Conely MRN: [id number] DOB: [birth year]    ADMISSION DATE:  04/06/2017 CONSULTATION DATE:  03/18/2017  REFERRING MD:  Malvin Johns, M.D. / EDP  CHIEF COMPLAINT:  Acute hypoxic respiratory failure  HISTORY OF PRESENT ILLNESS:  History obtained from the emergency department provider as well as the electronic medical record and wife with patient currently intubated. 71 y.o. male with history of chronic renal failure, rheumatoid arthritis, morbid obesity, and atrial fibrillation. Presenting with altered mental status. Currently residing at a local skilled nursing facility. Patient was in his normal state of health approximately 2 days ago oriented and conversational. Over the weekend experienced progressive decline in mental status with noted delusions. Episodes of hypoxia requiring supplemental oxygen were also noted. Staff have not noticed any fever, vomiting, or other illnesses. In the emergency room the patient's respiratory status progressively deteriorated and oxygen requirement continued to increase. In the setting of his continued altered and worsening mentation he was endotracheally intubated. Initially the patient was administered Lasix thinking this could represent a congestive heart failure exacerbation but with the higher probability of healthcare associated pneumonia sepsis protocol was initiated. Patient reportedly hospitalized in October for food impaction despite previous Botox administration for achalasia. Patient was transitioned to rehabilitation last Wednesday. On Friday evening his wife noticed that he was more confused. She reports decreased intake of his usual sustenance: Soups. She denies any aspiration events. Staff did note cough productive of a yellow mucus. He did previously have his influenza vaccine. He is maintained on prednisone 5 mg daily in addition to his monoclonal antibody for treatment of his underlying rheumatoid  arthritis. The patient is also on systemic anticoagulation for his atrial fibrillation.  PAST MEDICAL HISTORY :  He  has a past medical history of Achalasia, Atrial fibrillation ([place]), CHF (congestive heart failure) ([place]), Chronic renal failure, Food impaction of esophagus, and Rheumatoid arthritis ([place]).  PAST SURGICAL HISTORY: He  has a past surgical history that includes Ankle fracture surgery (1978); Esophagogastroduodenoscopy; and Esophagoscopy w/botox injection (01/03/2017).  Allergies  Allergen Reactions  . Bupropion Other (See Comments)    unspecified  . Pilocarpine Other (See Comments)    unspecified  . Prevacid [Lansoprazole] Other (See Comments)    unspecified  . Sulfasalazine Other (See Comments)    unspecified  . Sulfamethoxazole Rash    No current facility-administered medications on file prior to encounter.    Current Outpatient Medications on File Prior to Encounter  Medication Sig  . alfuzosin (UROXATRAL) 10 MG 24 hr tablet Take 10 mg daily with breakfast by mouth.  Marland Kitchen allopurinol (ZYLOPRIM) 300 MG tablet Take 300 mg at bedtime by mouth.  . ARTIFICIAL TEAR OINTMENT OP Place 1 application daily as needed into both eyes (dry eyes).  Marland Kitchen aspirin 81 MG chewable tablet Chew 81 mg daily by mouth.  . calcitRIOL (ROCALTROL) 0.25 MCG capsule Take 0.25 mcg every Monday, Wednesday, and Friday by mouth.  . cyclobenzaprine (FLEXERIL) 5 MG tablet Take 5 mg 2 (two) times daily as needed by mouth for muscle spasms.  . diclofenac sodium (VOLTAREN) 1 % GEL Apply 4 g 4 (four) times daily as needed topically (pain).  Marland Kitchen edoxaban (SAVAYSA) 60 MG TABS tablet Take 60 mg daily by mouth.  . ferrous sulfate 325 (65 FE) MG tablet Take 325 mg daily with breakfast by mouth.  . furosemide (LASIX) 40 MG tablet Take 40 mg daily by mouth.  . gabapentin (NEURONTIN) 100 MG capsule Take 100  mg at bedtime by mouth.  Marland Kitchen HYDROcodone-acetaminophen (NORCO/VICODIN) 5-325 MG tablet Take 1 tablet every 6 (six)  hours as needed by mouth for moderate pain.  Marland Kitchen lisinopril (PRINIVIL,ZESTRIL) 5 MG tablet Take 5 mg daily by mouth.  . metoprolol tartrate (LOPRESSOR) 12.5 mg TABS tablet Take 12.5 mg 2 (two) times daily by mouth.  . morphine (MSIR) 15 MG tablet Take 30-45 mg See admin instructions by mouth. 43m by mouth twice daily, 420mat bedtime  . ondansetron (ZOFRAN) 4 MG tablet Take 4 mg every 8 (eight) hours by mouth.  . pantoprazole (PROTONIX) 40 MG tablet Take 40 mg 2 (two) times daily by mouth.  . polyethylene glycol (MIRALAX / GLYCOLAX) packet Take 17 g daily by mouth.  . predniSONE (DELTASONE) 5 MG tablet Take 5 mg daily with breakfast by mouth.  . ranitidine (ZANTAC) 150 MG tablet Take 150 mg 2 (two) times daily by mouth.  . sennosides-docusate sodium (SENOKOT-S) 8.6-50 MG tablet Take 1 tablet 2 (two) times daily by mouth.  . Tocilizumab 162 MG/0.9ML SOSY Inject 0.9 mLs once a week into the skin.  . Marland Kitchenenlafaxine XR (EFFEXOR-XR) 150 MG 24 hr capsule Take 150 mg daily with breakfast by mouth.  . vitamin C (ASCORBIC ACID) 500 MG tablet Take 500 mg daily by mouth.    FAMILY HISTORY:  His indicated that the status of his mother is unknown. He indicated that the status of his brother is unknown.   SOCIAL HISTORY: He  reports that he quit smoking about 35 years ago. He started smoking about 67 years ago. He has a 56.00 pack-year smoking history. he has never used smokeless tobacco. He reports that he does not drink alcohol or use drugs.  REVIEW OF SYSTEMS:   Unable to obtain given critical state.  SUBJECTIVE:  Afebrile and stable overnight. No acute events. Remains on pressors and BiPAP.  VITAL SIGNS: BP (!) 144/65   Pulse 72   Temp 98.2 F (36.8 C) (Oral)   Resp (!) 23   Ht 5' 9"  (1.753 m)   Wt 262 lb 5.6 oz (119 kg)   SpO2 100%   BMI 38.74 kg/m   HEMODYNAMICS:    VENTILATOR SETTINGS: Vent Mode: BIPAP FiO2 (%):  [40 %] 40 % Set Rate:  [8 bmp-12 bmp] 8 bmp PEEP:  [6 cmH20] 6  cmH20  INTAKE / OUTPUT: I/O last 3 completed shifts: In: 2182.9 [I.V.:1782.9; IV Piggyback:400] Out: 2450 [Urine:2450] UOP 1.615 L last 24 hours  PHYSICAL EXAMINATION: General: ill-appearing male lying in bed, NAD with non-toxic appearance HEENT: normocephalic, atraumatic, moist mucous membranes, PERRLA Neck: supple, non-tender without lymphadenopathy Cardiovascular: regular rate and rhythm without murmurs, rubs, or gallops Lungs: coarse breath sounds bilaterally with normal work of breathing on BiPAP Abdomen: obese, soft, non-tender, non-distended, normoactive bowel sounds Skin: warm, dry, no rashes or lesions, cap refill < 2 seconds Extremities: warm and well perfused, normal tone, no edema Neuro: gross motor function intact in all 4 extremities Psych: minimally agitated but able to reorient  LABS:  BMET Recent Labs  Lab 03/26/17 0619 03/27/17 0345 03/28/17 0510  NA 139 143 146*  K 3.6 3.1* 3.6  CL 107 112* 116*  CO2 22 22 16*  BUN 67* 61* 66*  CREATININE 2.95* 2.28* 2.26*  GLUCOSE 121* 131* 205*    Electrolytes Recent Labs  Lab 03/21/2017 2041 03/25/17 0607 03/26/17 0619 03/27/17 0345 03/28/17 0510  CALCIUM 8.7* 8.8* 9.3 9.9 10.7*  MG 2.2 2.1  --   --   --  PHOS 5.0* 4.4  --   --   --     CBC Recent Labs  Lab 03/26/17 0619 03/27/17 0345 03/28/17 0510  WBC 21.8* 11.5* 28.1*  HGB 9.4* 9.0* 9.7*  HCT 29.3* 27.8* 30.0*  PLT 467* 308 498*    Coag's Recent Labs  Lab 03/15/2017 2041  APTT 51*  INR 2.66    Sepsis Markers Recent Labs  Lab 04/11/2017 2041 03/25/17 0138 03/25/17 0607 03/26/17 0619  LATICACIDVEN 1.3 1.1 1.5  --   PROCALCITON 0.66  --  0.78 0.58    ABG Recent Labs  Lab 04/07/2017 1717 03/25/17 0347 03/25/17 0601  PHART 7.296* 7.279* 7.338*  PCO2ART 53.8* 46.5 39.9  PO2ART 146.0* 70.4* 67.0*    Liver Enzymes Recent Labs  Lab 04/03/2017 1305 03/26/17 0619  AST 29 18  ALT 29 21  ALKPHOS 138* 99  BILITOT 1.2 0.9  ALBUMIN  2.8* 2.2*    Cardiac Enzymes Recent Labs  Lab 03/20/2017 2041 03/25/17 0139 03/25/17 0607  TROPONINI <0.03 <0.03 <0.03    Glucose Recent Labs  Lab 03/27/17 1523 03/27/17 1952 03/28/17 0055 03/28/17 0523 03/28/17 0836 03/28/17 1057  GLUCAP 126* 149* 154* 184* 147* 176*    Imaging No results found.   STUDIES:  CXR 11/11 >> mild perihilar and bibasilar opacification likely interstitial edema, less likely infectious CT head WO 11/11 >> moderate region of low attenuation in the inferior aspect of right cerebellar hemisphere likely acute to subacute infarction, although chronic ischemia changes possible, recommending MRI, minimal chronic ischemic microvascular disease evident CXR 11/11 >> stable ETT, low lung volumes, stable mild cardiomegaly, stable linear and hazy perihilar and bibasilar lung opacities favoring mild pulmonary edema and atelectasis CXR 11/12 >> stable ETT, stable cardiomegaly with increased bilateral edema, left greater than right pleural effusion, bibasilar airspace disease likely representing atelectasis in CHF, less likely infectious CXR 11/12 >> LIJ stable, no pneumothorax, cardiomegaly without edema, probable small L-pleural effusion MRI brain WO 11/13 >> acute right cerebellar infarct with trace petecial hemorrahe, region mass effect, no hydrocephalus CT head WO 11/13 >> evolving right cerebellar/posterior-inferior cerebellar artery territory infarct without hemorrhagic conversion, regional mass effect, no hydrocephalus CXR 11/14 >> support apparatus stable, stable cardiomegaly, edema, and bilateral effusions compatible with heart failure, stable slight increase in bilateral bibasilar airspace disease likely atelectasis with infection less likely  CULTURES: Respiratory culture 11/12 >> abundant WBC, predominantly PMNs, abundant GPC, culture pending Blood culture 11/11 >> NGTD Urine culture 11/11 >> NGTD RVP PCR 11/11 >> NGTD  ANTIBIOTICS: Azithromycin  11/11-11/12 Vancomycin IV 11/11-11/14 Zosyn IV 11/11-11/14 Cefazolin 11/14 >>  SIGNIFICANT EVENTS: 11/11 >> admit for AMS requiring intubation 11/12 >> phenylephrine gtt started for hypotension 11/13 >> MRI confirmed right cerebellar infract with regional mass effect 11/14 >> extubated and placed on BiPAP 11/15 >> Palliative consulted, patient switched to comfort measures  LINES/TUBES: ETT 11/11-11/14 Arterial line R-radial 11/12 >> LIJ triple lumen 11/13 >> PIV L-hand 11/11 >> PIV L-wrist 11/11-11/12 PIV L-forearm 11/11 >> Urethral catheter 11/11 >>  DISCUSSION: Patient is a 71 year old male on chronic suppression with monoclonal antibody for rheumatoid arthritis as well as prednisone 5 mg daily and known achalasia with repetitive food impaction presenting with acute encephalopathy and acute hypoxic respiratory failure with hypotension suspected to be secondary to HAP requiring intubation and pressor support.  ASSESSMENT / PLAN:  PULMONARY Acute hypoxic respiratory failure: off vent 11/14, now DNR but okay for BiPAP short-term - Palliative consulted, appreciate recommendations - Full comfort measures  per family decision 11/15, plan to d/c BiPAP and pressors once daighter arrives later today - Treatment for HAP per below - Continuous pulse ox  - Oral hygiene   CARDIOVASCULAR Elevated troponin I: Suspected cardiac ischemia in the setting of shock and hypoxia Atrial fibrillation Hypotension - Telemetry - Daily weights and I&O - Holding home Savaysa given acute stroke, continue daily aspirin 81 mg - Neo gtt  RENAL   Acute on chronic renal failure: Suspected to be secondary to hypotension, improving Hyperkalemia: Resolved Hypernatremia - Avoid nephrotoxic agents - Telemetry - KVO  GASTROINTESTINAL Nutrition Achalasia on liquid diet at home - NPO  HEMATOLOGIC Anemia: Stable without signs of active bleeding Coagulopathy with chronic oral anticoagulation -  SCDs  INFECTIOUS   Septic shock secondary to healthcare acquired pneumonia  - Day 3 cefazolin s/p 4 days vancomycin+Zosyn - Droplet precautions  ENDOCRINE Suspected adrenal insufficiency Chronic glucose corticoid use  - Continue stress dose steroids with hydrocortisone 50 mg IV every 6 hours  NEUROLOGIC  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

## 2017-04-08 ENCOUNTER — Telehealth: Payer: Self-pay

## 2017-04-08 NOTE — Telephone Encounter (Signed)
On 04/08/17 I received a d/c from [employer] (original). The d/c is for cremation. The patient is a patient of Doctor Sood. The d/c will be taken to Pulmonary Unit @ [facility] this pm for signature.  On 11/27/18I received the d/c back from Doctor [place]. I got the d/c ready and called the funeral home to let them know the d/c is ready for pickup. I also faxed a copy to the funeral home per the funeral home request.

## 2017-04-13 NOTE — Death Summary Note (Signed)
Earl Barber was a 71 y.o. male former smoker admitted on 03/19/2017 with altered mental status from SNF.  He was having episodes of delusions and hypoxia.  He was severely debilitated from history of rheumatoid arthritis.  He was noted to have episodes of hypoxia.  He required intubation in the emergency room.  He was started on antibiotics for healthcare acquired pneumonia, and lasix for pulmonary edema.  CT head showed Rt cerebellar and posterior inferior cerebellar artery territory infarcts.  He was evaluated by neurology.  Due to concern for significant morbidity related to his stroke and his already debilitated state prior to admission, the family decided for DNR status and transition to comfort measures.  He was extubated on 03/27/17.  He subsequently expired on 2017-04-06 at 739 am.  Final diagnoses: Acute hypoxic respiratory failure CKD 3 Rheumatoid arthritis on chronic prednisone therapy Atrial fibrillation Achalasia Elevated troponin from demand ischemia from NSTEMI Acute systolic CHF with acute pulmonary edema Anemia of critical illness and chronic disease Septic shock from HCAP Relative adrenal insufficiency Acute right cerebellar infarct from embolic event in setting of A fib  Coralyn Helling, MD [facility] Pulmonary/Critical Care 04/07/2017, 9:57 AM

## 2017-04-13 NOTE — Progress Notes (Signed)
Pt remains on comfort care, gtt's appropriate for pt comfort. Breathing unlabored, no response to stimuli. Family went home at 2100 last night, plan on returning today. Will continue to monitor.   Ulice Dash, Charity fundraiser

## 2017-04-13 NOTE — Progress Notes (Signed)
PULMONARY / CRITICAL CARE MEDICINE   Name: Kaisei Gilbo MRN: [id number] DOB: 15-Sep-1945    ADMISSION DATE:  04/05/2017 CONSULTATION DATE:  04/10/2017  REFERRING MD:  Malvin Johns, M.D. / EDP  CHIEF COMPLAINT:  Acute hypoxic respiratory failure  HISTORY OF PRESENT ILLNESS:  History obtained from the emergency department provider as well as the electronic medical record and wife with patient currently intubated. 71 y.o. male with history of chronic renal failure, rheumatoid arthritis, morbid obesity, and atrial fibrillation. Presenting with altered mental status. Currently residing at a local skilled nursing facility. Patient was in his normal state of health approximately 2 days ago oriented and conversational. Over the weekend experienced progressive decline in mental status with noted delusions. Episodes of hypoxia requiring supplemental oxygen were also noted. Staff have not noticed any fever, vomiting, or other illnesses. In the emergency room the patient's respiratory status progressively deteriorated and oxygen requirement continued to increase. In the setting of his continued altered and worsening mentation he was endotracheally intubated. Initially the patient was administered Lasix thinking this could represent a congestive heart failure exacerbation but with the higher probability of healthcare associated pneumonia sepsis protocol was initiated. Patient reportedly hospitalized in October for food impaction despite previous Botox administration for achalasia. Patient was transitioned to rehabilitation last Wednesday. On Friday evening his wife noticed that he was more confused. She reports decreased intake of his usual sustenance: Soups. She denies any aspiration events. Staff did note cough productive of a yellow mucus. He did previously have his influenza vaccine. He is maintained on prednisone 5 mg daily in addition to his monoclonal antibody for treatment of his underlying rheumatoid  arthritis. The patient is also on systemic anticoagulation for his atrial fibrillation.  PAST MEDICAL HISTORY :  He  has a past medical history of Achalasia, Atrial fibrillation ([place]), CHF (congestive heart failure) ([place]), Chronic renal failure, Food impaction of esophagus, and Rheumatoid arthritis ([place]).  PAST SURGICAL HISTORY: He  has a past surgical history that includes Ankle fracture surgery (1978); Esophagogastroduodenoscopy; and Esophagoscopy w/botox injection (01/03/2017).  Allergies  Allergen Reactions  . Bupropion Other (See Comments)    unspecified  . Pilocarpine Other (See Comments)    unspecified  . Prevacid [Lansoprazole] Other (See Comments)    unspecified  . Sulfasalazine Other (See Comments)    unspecified  . Sulfamethoxazole Rash    No current facility-administered medications on file prior to encounter.    Current Outpatient Medications on File Prior to Encounter  Medication Sig  . alfuzosin (UROXATRAL) 10 MG 24 hr tablet Take 10 mg daily with breakfast by mouth.  Marland Kitchen allopurinol (ZYLOPRIM) 300 MG tablet Take 300 mg at bedtime by mouth.  . ARTIFICIAL TEAR OINTMENT OP Place 1 application daily as needed into both eyes (dry eyes).  Marland Kitchen aspirin 81 MG chewable tablet Chew 81 mg daily by mouth.  . calcitRIOL (ROCALTROL) 0.25 MCG capsule Take 0.25 mcg every Monday, Wednesday, and Friday by mouth.  . cyclobenzaprine (FLEXERIL) 5 MG tablet Take 5 mg 2 (two) times daily as needed by mouth for muscle spasms.  . diclofenac sodium (VOLTAREN) 1 % GEL Apply 4 g 4 (four) times daily as needed topically (pain).  Marland Kitchen edoxaban (SAVAYSA) 60 MG TABS tablet Take 60 mg daily by mouth.  . ferrous sulfate 325 (65 FE) MG tablet Take 325 mg daily with breakfast by mouth.  . furosemide (LASIX) 40 MG tablet Take 40 mg daily by mouth.  . gabapentin (NEURONTIN) 100 MG capsule Take 100  mg at bedtime by mouth.  Marland Kitchen HYDROcodone-acetaminophen (NORCO/VICODIN) 5-325 MG tablet Take 1 tablet every 6 (six)  hours as needed by mouth for moderate pain.  Marland Kitchen lisinopril (PRINIVIL,ZESTRIL) 5 MG tablet Take 5 mg daily by mouth.  . metoprolol tartrate (LOPRESSOR) 12.5 mg TABS tablet Take 12.5 mg 2 (two) times daily by mouth.  . morphine (MSIR) 15 MG tablet Take 30-45 mg See admin instructions by mouth. 58m by mouth twice daily, 421mat bedtime  . ondansetron (ZOFRAN) 4 MG tablet Take 4 mg every 8 (eight) hours by mouth.  . pantoprazole (PROTONIX) 40 MG tablet Take 40 mg 2 (two) times daily by mouth.  . polyethylene glycol (MIRALAX / GLYCOLAX) packet Take 17 g daily by mouth.  . predniSONE (DELTASONE) 5 MG tablet Take 5 mg daily with breakfast by mouth.  . ranitidine (ZANTAC) 150 MG tablet Take 150 mg 2 (two) times daily by mouth.  . sennosides-docusate sodium (SENOKOT-S) 8.6-50 MG tablet Take 1 tablet 2 (two) times daily by mouth.  . Tocilizumab 162 MG/0.9ML SOSY Inject 0.9 mLs once a week into the skin.  . Marland Kitchenenlafaxine XR (EFFEXOR-XR) 150 MG 24 hr capsule Take 150 mg daily with breakfast by mouth.  . vitamin C (ASCORBIC ACID) 500 MG tablet Take 500 mg daily by mouth.    FAMILY HISTORY:  His indicated that the status of his mother is unknown. He indicated that the status of his brother is unknown.   SOCIAL HISTORY: He  reports that he quit smoking about 35 years ago. He started smoking about 67 years ago. He has a 56.00 pack-year smoking history. he has never used smokeless tobacco. He reports that he does not drink alcohol or use drugs.  REVIEW OF SYSTEMS:   Unable to obtain given critical state.  SUBJECTIVE:  Pain and agitation well controlled overnight. On comfort measures.  VITAL SIGNS: BP 135/69   Pulse (!) 106   Temp 98.2 F (36.8 C) (Oral)   Resp 18   Ht _0  (1.753 m)   Wt 262 lb 5.6 oz (119 kg)   SpO2 (!) 88%   BMI 38.74 kg/m   HEMODYNAMICS:    VENTILATOR SETTINGS: Vent Mode: BIPAP FiO2 (%):  [40 %] 40 % Set Rate:  [8 bmp] 8 bmp PEEP:  [6 cmH20] 6 cmH20  INTAKE /  OUTPUT: I/O last 3 completed shifts: In: 1966.6 [I.V.:1566.6; IV Piggyback:400] Out: 2305 [Urine:2305] UOP 0.675 L last 24 hours  PHYSICAL EXAMINATION: General: ill-appearing male lying in bed, NAD HEENT: normocephalic, atraumatic, moist mucous membranes, PERRLA Neck: supple, non-tender without lymphadenopathy Cardiovascular: regular rate and rhythm without murmurs, rubs, or gallops Lungs: coarse breath sounds bilaterally with increased work of breathing on room air Abdomen: obese, soft, non-tender, non-distended, normoactive bowel sounds Skin: warm, dry, no rashes or lesions, cap refill < 2 seconds Extremities: warm and well perfused, normal tone, no edema Neuro: gross motor function intact in all 4 extremities Psych: calm, non-tremulous  LABS:  BMET Recent Labs  Lab 03/26/17 0619 03/27/17 0345 03/28/17 0510  NA 139 143 146*  K 3.6 3.1* 3.6  CL 107 112* 116*  CO2 22 22 16*  BUN 67* 61* 66*  CREATININE 2.95* 2.28* 2.26*  GLUCOSE 121* 131* 205*    Electrolytes Recent Labs  Lab 03/22/2017 2041 03/25/17 0607 03/26/17 0619 03/27/17 0345 03/28/17 0510  CALCIUM 8.7* 8.8* 9.3 9.9 10.7*  MG 2.2 2.1  --   --   --   PHOS 5.0* 4.4  --   --   --  CBC Recent Labs  Lab 03/26/17 0619 03/27/17 0345 03/28/17 0510  WBC 21.8* 11.5* 28.1*  HGB 9.4* 9.0* 9.7*  HCT 29.3* 27.8* 30.0*  PLT 467* 308 498*    Coag's Recent Labs  Lab 03/16/2017 2041  APTT 51*  INR 2.66    Sepsis Markers Recent Labs  Lab 03/28/2017 2041 03/25/17 0138 03/25/17 0607 03/26/17 0619  LATICACIDVEN 1.3 1.1 1.5  --   PROCALCITON 0.66  --  0.78 0.58    ABG Recent Labs  Lab 03/23/2017 1717 03/25/17 0347 03/25/17 0601  PHART 7.296* 7.279* 7.338*  PCO2ART 53.8* 46.5 39.9  PO2ART 146.0* 70.4* 67.0*    Liver Enzymes Recent Labs  Lab 03/31/2017 1305 03/26/17 0619  AST 29 18  ALT 29 21  ALKPHOS 138* 99  BILITOT 1.2 0.9  ALBUMIN 2.8* 2.2*    Cardiac Enzymes Recent Labs  Lab  03/14/2017 2041 03/25/17 0139 03/25/17 0607  TROPONINI <0.03 <0.03 <0.03    Glucose Recent Labs  Lab 03/27/17 1523 03/27/17 1952 03/28/17 0055 03/28/17 0523 03/28/17 0836 03/28/17 1057  GLUCAP 126* 149* 154* 184* 147* 176*    Imaging No results found.   STUDIES:  CXR 11/11 >> mild perihilar and bibasilar opacification likely interstitial edema, less likely infectious CT head WO 11/11 >> moderate region of low attenuation in the inferior aspect of right cerebellar hemisphere likely acute to subacute infarction, although chronic ischemia changes possible, recommending MRI, minimal chronic ischemic microvascular disease evident CXR 11/11 >> stable ETT, low lung volumes, stable mild cardiomegaly, stable linear and hazy perihilar and bibasilar lung opacities favoring mild pulmonary edema and atelectasis CXR 11/12 >> stable ETT, stable cardiomegaly with increased bilateral edema, left greater than right pleural effusion, bibasilar airspace disease likely representing atelectasis in CHF, less likely infectious CXR 11/12 >> LIJ stable, no pneumothorax, cardiomegaly without edema, probable small L-pleural effusion MRI brain WO 11/13 >> acute right cerebellar infarct with trace petecial hemorrahe, region mass effect, no hydrocephalus CT head WO 11/13 >> evolving right cerebellar/posterior-inferior cerebellar artery territory infarct without hemorrhagic conversion, regional mass effect, no hydrocephalus CXR 11/14 >> support apparatus stable, stable cardiomegaly, edema, and bilateral effusions compatible with heart failure, stable slight increase in bilateral bibasilar airspace disease likely atelectasis with infection less likely  CULTURES: Respiratory culture 11/12 >> abundant WBC, predominantly PMNs, abundant GPC, culture pending Blood culture 11/11 >> NGTD Urine culture 11/11 >> NGTD RVP PCR 11/11 >> NGTD  ANTIBIOTICS: Azithromycin 11/11-11/12 Vancomycin IV 11/11-11/14 Zosyn IV  11/11-11/14 Cefazolin 11/14-11/16  SIGNIFICANT EVENTS: 11/11 >> admit for AMS requiring intubation 11/12 >> phenylephrine gtt started for hypotension 11/13 >> MRI confirmed right cerebellar infract with regional mass effect 11/14 >> extubated and placed on BiPAP 11/15 >> palliative consulted, patient switched to comfort measures  LINES/TUBES: ETT 11/11-11/14 Arterial line R-radial 11/12-11/16 LIJ triple lumen 11/13 >> PIV L-hand 11/11 >> PIV L-wrist 11/11-11/12 PIV L-forearm 11/11 >> Urethral catheter 11/11 >>  DISCUSSION: Patient is a 71 year old male on chronic suppression with monoclonal antibody for rheumatoid arthritis as well as prednisone 5 mg daily and known achalasia with repetitive food impaction presenting with acute encephalopathy and acute hypoxic respiratory failure with hypotension suspected to be secondary to HAP requiring intubation and pressor support.  ASSESSMENT / PLAN:  PULMONARY Acute hypoxic respiratory failure: off vent 11/14, now DNR with comfort measures off BiPAP - Palliative consulted, appreciate recommendations - Full comfort measures per family decision 11/15  CARDIOVASCULAR Elevated troponin I: Suspected cardiac ischemia in the setting of shock  and hypoxia Atrial fibrillation Hypotension - Full comfort measures per family decision 11/15  RENAL   Acute on chronic renal failure: Suspected to be secondary to hypotension, improving Hyperkalemia: Resolved Hypernatremia - Full comfort measures per family decision 11/15  GASTROINTESTINAL Nutrition Achalasia on liquid diet at home - NPO  HEMATOLOGIC Anemia: Stable without signs of active bleeding Coagulopathy with chronic oral anticoagulation - Full comfort measures per family decision 11/15  INFECTIOUS   Septic shock secondary to healthcare acquired pneumonia  - Full comfort measures per family decision 11/15  ENDOCRINE Suspected adrenal insufficiency Chronic glucose corticoid use  -  Full comfort measures per family decision 11/15  NEUROLOGIC  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

## 2017-04-13 NOTE — Progress Notes (Signed)
Patient time of death 22. Confirmed death with Ardith Dark and Dr. Craige Cotta on unit, informed and at bs. Wife and daughter notified and arrived at bs at 86. Comfort provided and information procured for arrangements. [facility] notified. Fentanyl 150 ml wasted and witnessed by [employer].

## 2017-04-13 DEATH — deceased

## 2018-10-10 IMAGING — RF DG NASO G TUBE PLC W/FL W/RAD
1 series · 2 of 2 positions shown · non-contrast
Comparison: Attempted exam of 1 day earlier.

CLINICAL DATA: Attempted feeding tube placement.

EXAM:
NASO G TUBE PLACEMENT WITH FL AND WITH RAD
FLUOROSCOPY TIME:  Fluoroscopy Time:  25 minutes and 45 seconds
Number of Acquired Spot Images: 0

[Series 1: run · 2 of 2 slices shown]
[im 1/2]
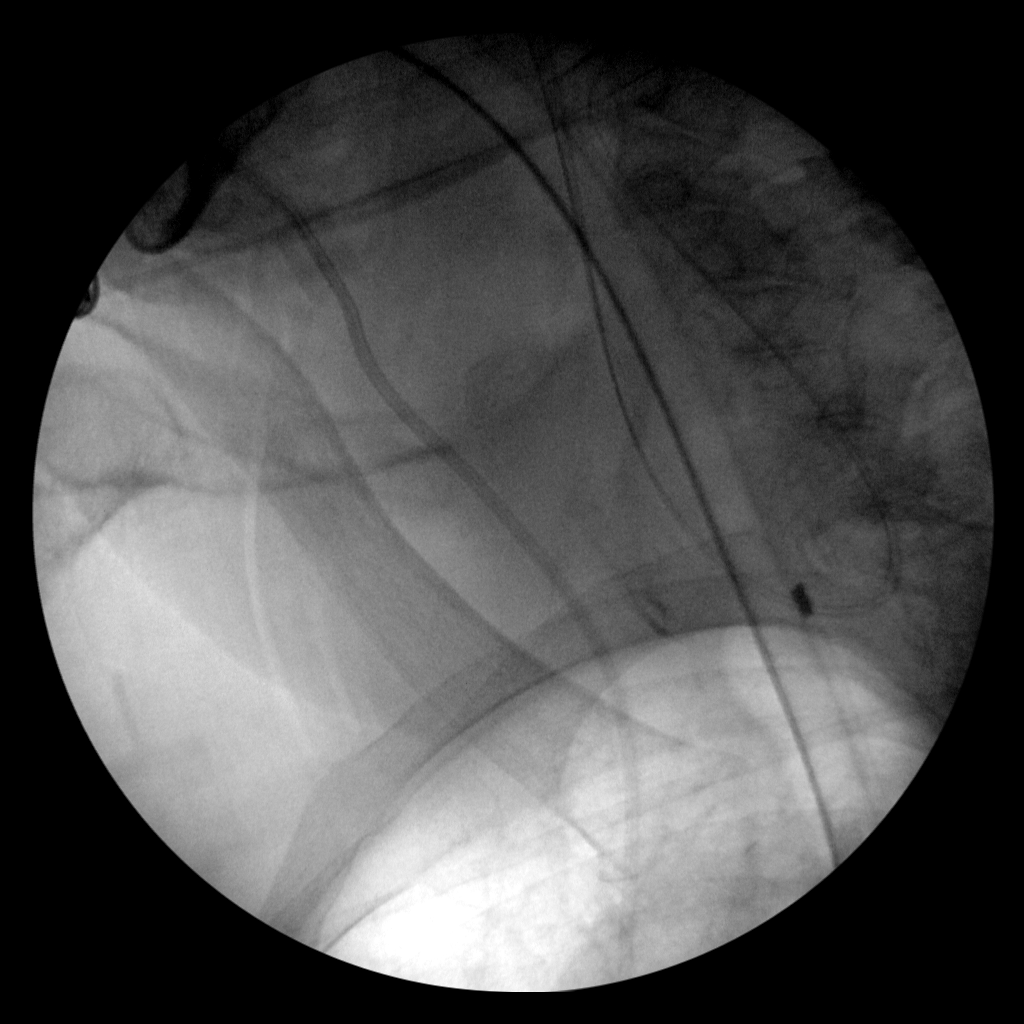
[im 2/2]
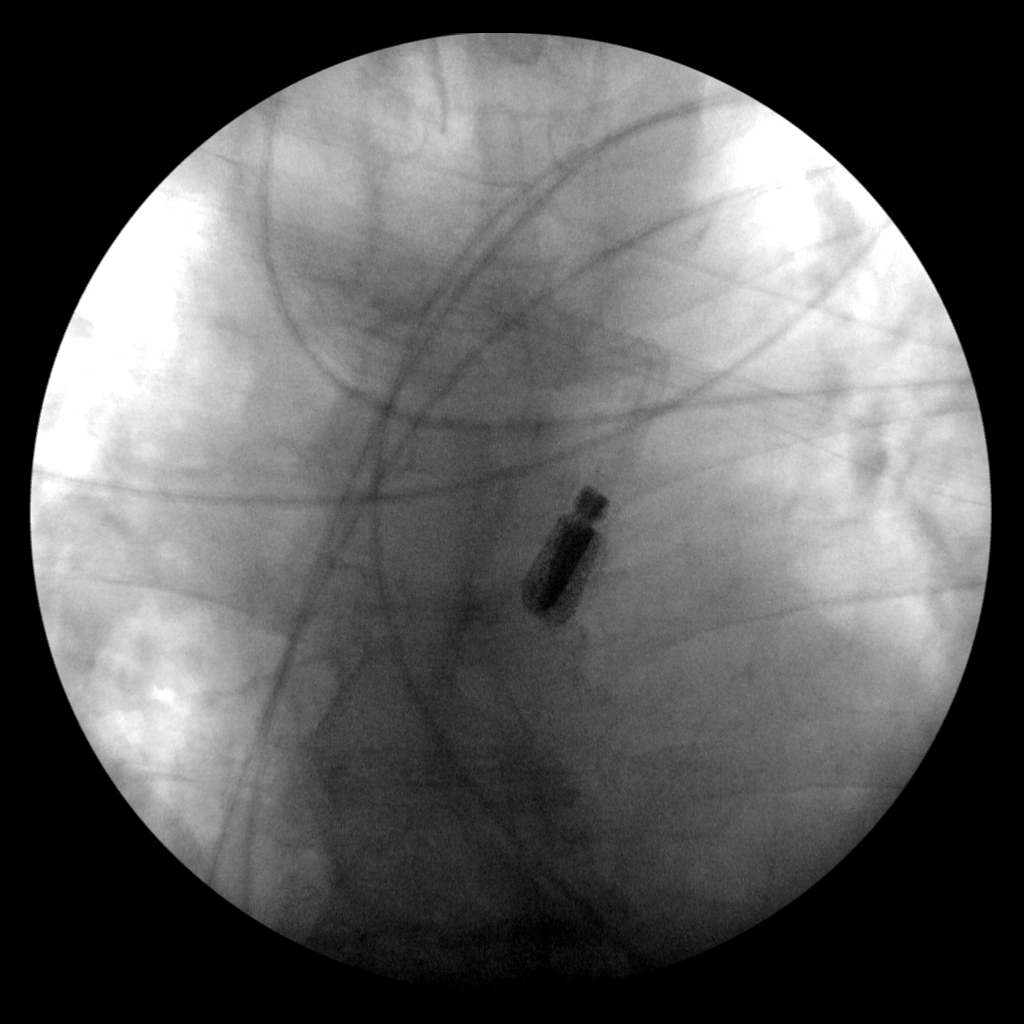

[2 of 2 positions shown; findings below may reference images not displayed]

FINDINGS: Despite multiple attempts by the technologist and the radiologist,
the feeding tube could not be placed successfully into the stomach.
Due to excessive radiation exposure of nearly 26 minutes, decision
was made to forego the procedure unsuccessfully.
IMPRESSION: Unsuccessful placement of feeding tube, as detailed above.

## 2018-10-10 IMAGING — CT CT HEAD W/O CM
4 series · 16 of 47 positions shown, 18 images · non-contrast
Comparison: MRI of the head March 26, 2017 at 9046 hours and CT
HEAD March 24, 2017.

CLINICAL DATA: Follow-up stroke.

EXAM:
CT HEAD WITHOUT CONTRAST
TECHNIQUE: Contiguous axial images were obtained from the base of the skull
through the vertex without intravenous contrast.

[Series 3: head without · axial · non-contrast · 0.46mm/px · z∈[-140,-10]mm · 7 of 36 slices shown, 9 images]
[im 5/36  brain]
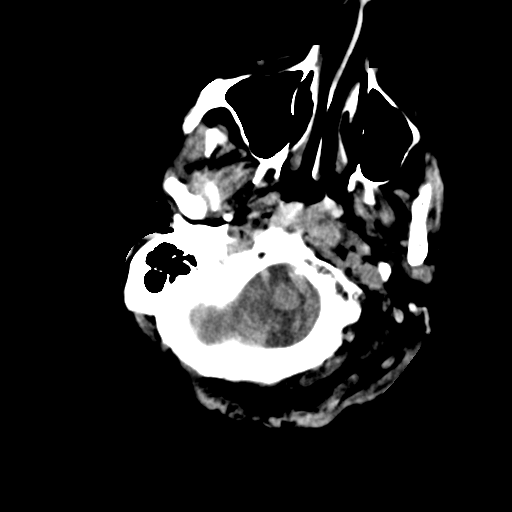
[im 5/36  bone]
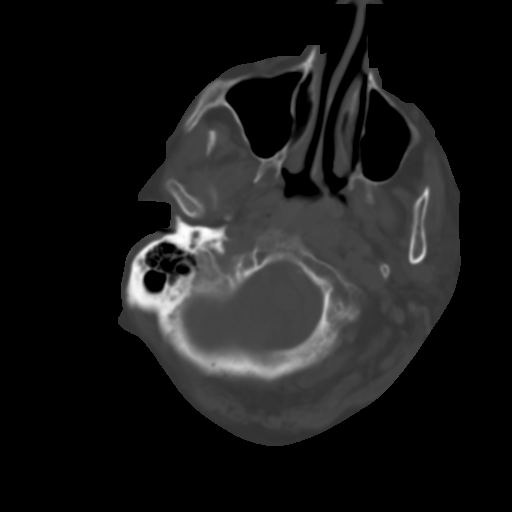
[im 9/36  brain]
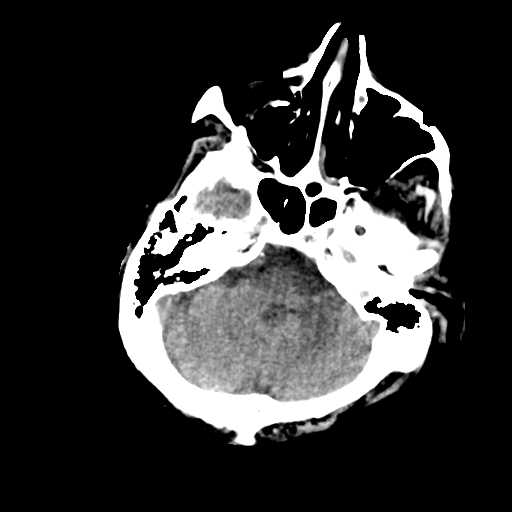
[im 14/36  brain]
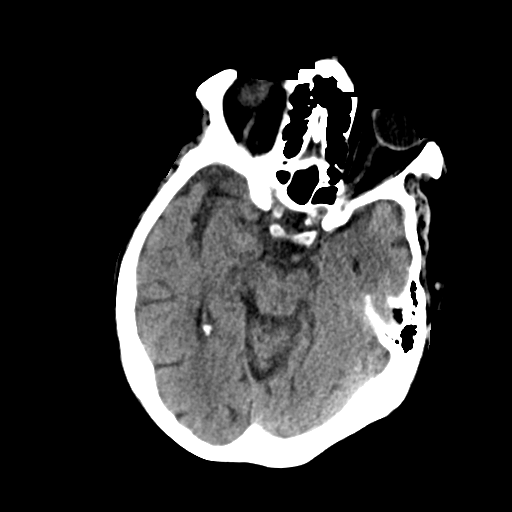
[im 18/36  brain]
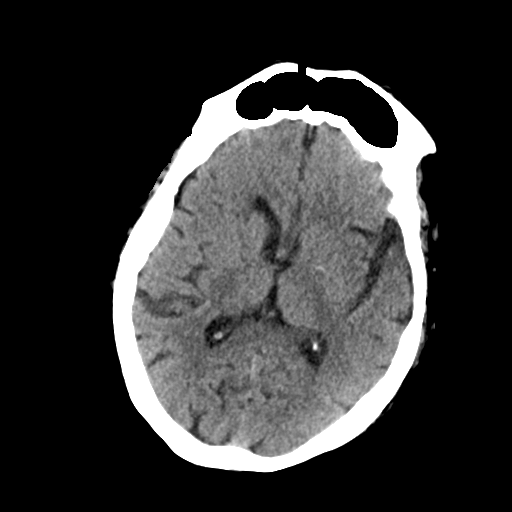
[im 22/36  brain]
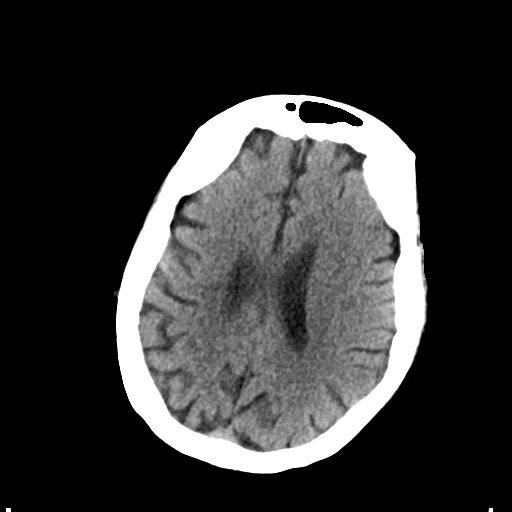
[im 22/36  bone]
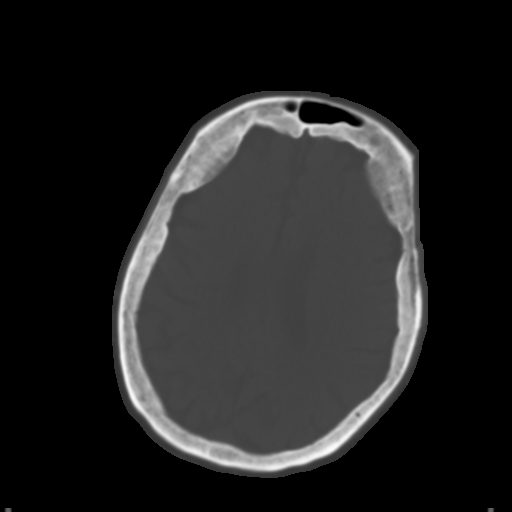
[im 27/36  brain]
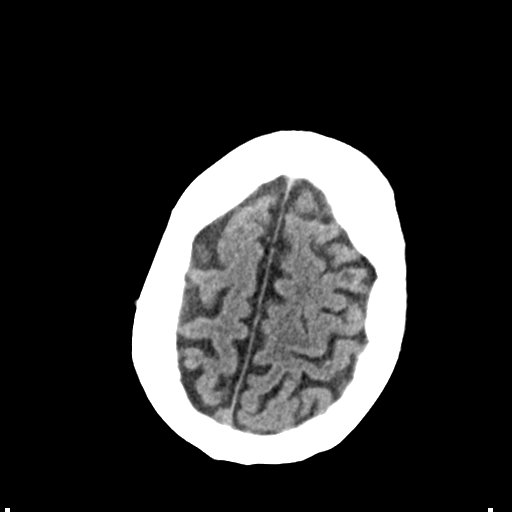
[im 31/36  brain]
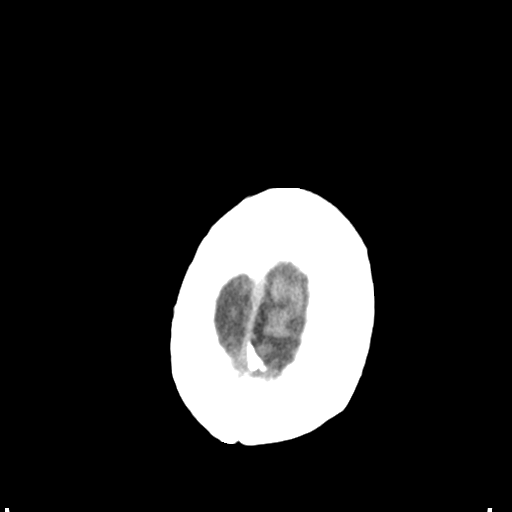

[Series 4: head bone · axial · 0.46mm/px · z∈[-144,-108]mm · 3 of 90 slices shown]
[im 9/90  bone]
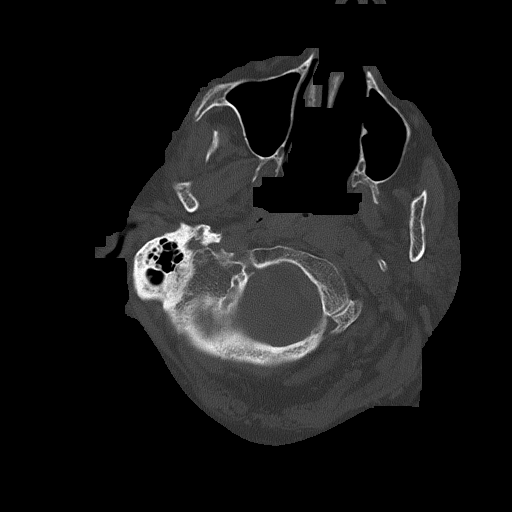
[im 18/90  bone]
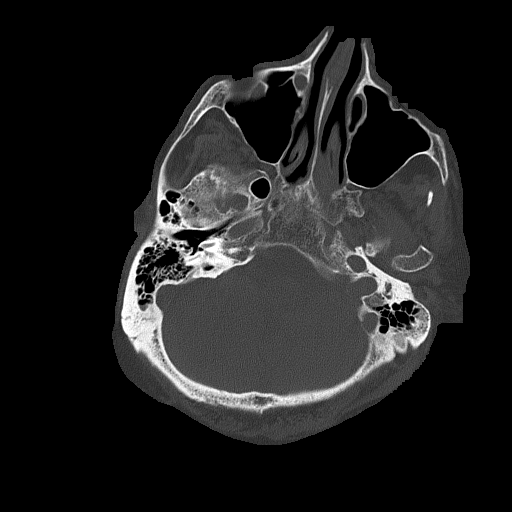
[im 27/90  bone]
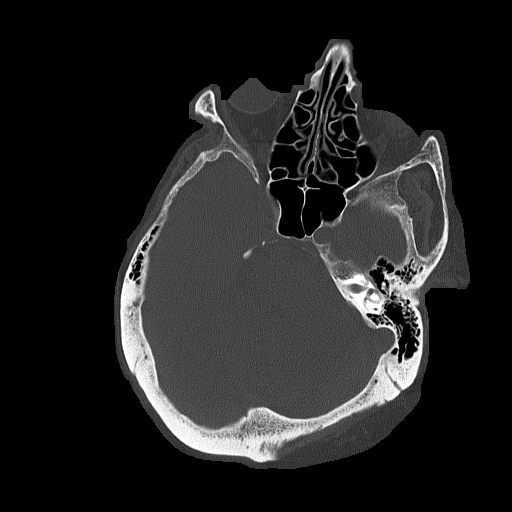

[Series 5: head without cor · coronal · non-contrast · 0.35mm/px · 3 of 75 slices shown]
[im 25/75  brain]
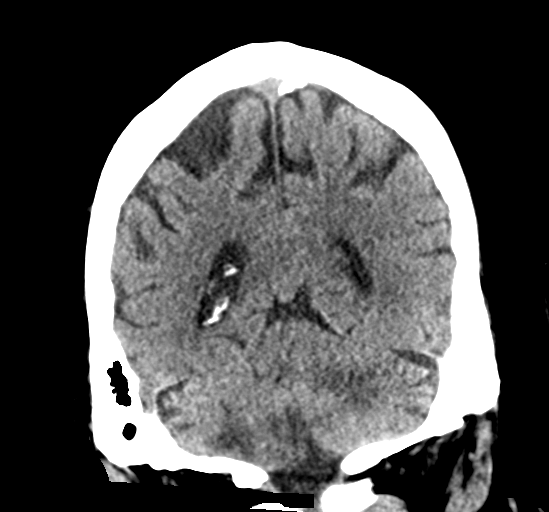
[im 33/75  brain]
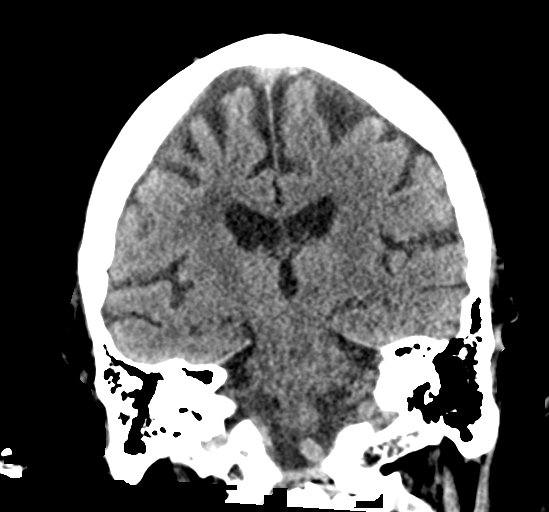
[im 42/75  brain]
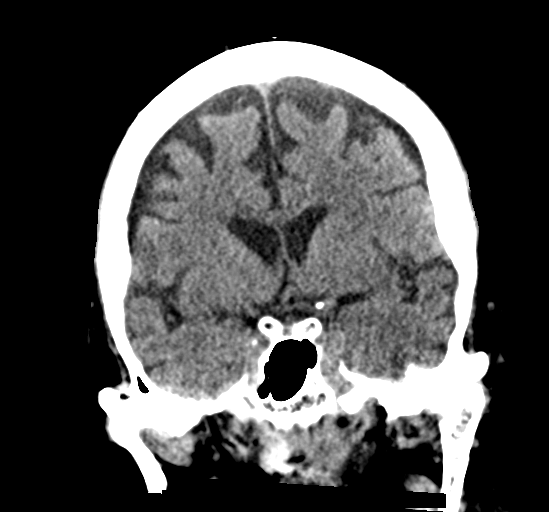

[Series 6: head without sag · sagittal · non-contrast · 0.35mm/px · 3 of 60 slices shown]
[im 20/60  brain]
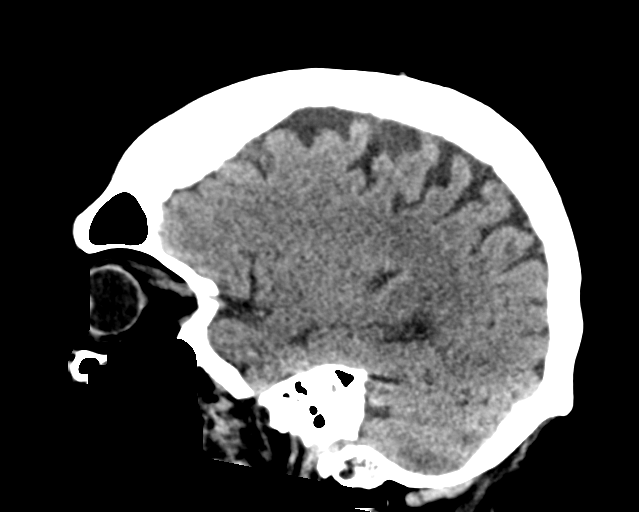
[im 30/60  brain]
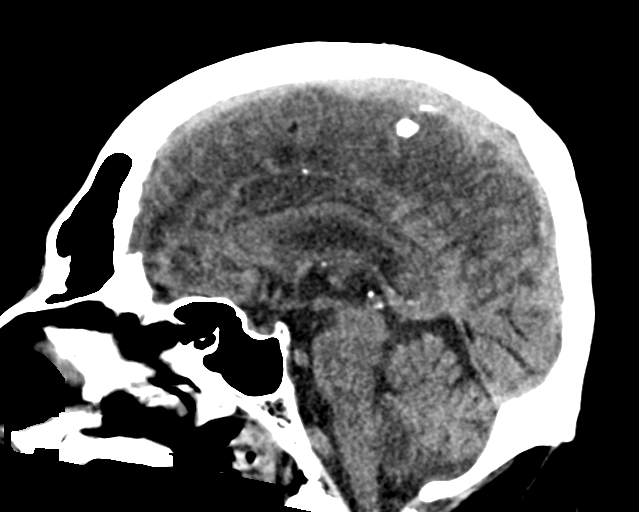
[im 40/60  brain]
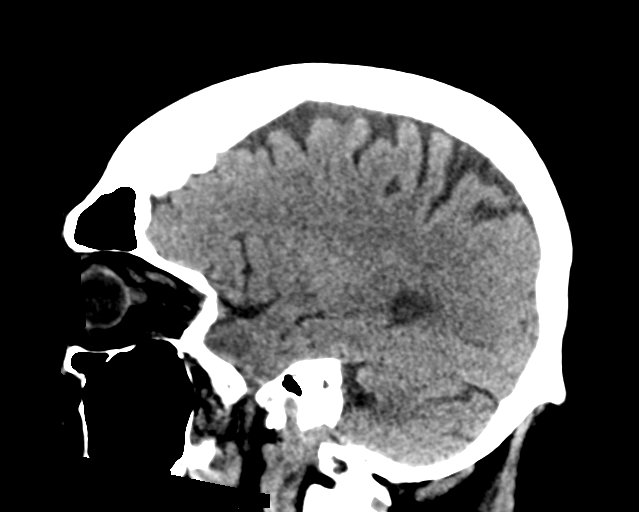

[16 of 47 positions shown; findings below may reference images not displayed]

FINDINGS: BRAIN: Evolving RIGHT inferior cerebellar infarct without
hemorrhagic conversion. Regional mass effect, patent fourth
ventricle. Known lung small cerebellar infarcts and tiny
supratentorial cortical infarcts better demonstrated on prior MRI.
Ventricles and sulci are overall normal for patient's age. Faint
supratentorial white matter hypodensities compatible with mild
chronic small vessel ischemic disease. No midline shift, mass effect
or acute large vascular territory infarcts. Basal cisterns are
patent.

VASCULAR: Trace calcific atherosclerosis of the carotid siphons.

SKULL: No skull fracture. No significant scalp soft tissue swelling.

SINUSES/ORBITS: Subcentimeter RIGHT maxillary mucosal retention
cysts without paranasal sinus air-fluid levels. Mastoid air cells
are well aerated. The included ocular globes and orbital contents
are non-suspicious.

OTHER: Life-support lines in place.
IMPRESSION: 1. Evolving acute RIGHT cerebellar/posterior-inferior cerebellar
artery territory infarct without hemorrhagic conversion.
2. Regional mass effect, no hydrocephalus.

## 2018-10-10 IMAGING — MR MR HEAD W/O CM
8 of 10 series · 36 of 48 positions shown · non-contrast
Comparison: CT HEAD March 24, 2017

CLINICAL DATA: Altered mental status. History of chronic renal
failure, atrial fibrillation. Suspect stroke.

EXAM:
MRI HEAD WITHOUT CONTRAST
TECHNIQUE: Multiplanar, multiecho pulse sequences of the brain and surrounding
structures were obtained without intravenous contrast.

[Series 4: DWI · axial · 3.0mm · 0.94mm/px · z∈[-75,+71]mm · 8 of 100 slices shown (1 of 2)]
[im 1/100]
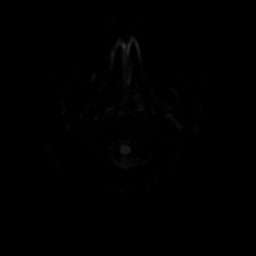
[im 12/100]
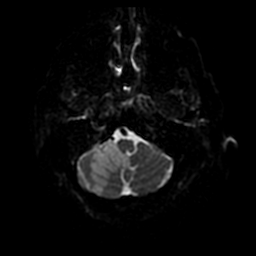
[im 34/100]
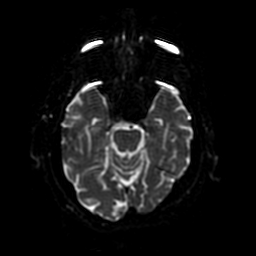
[im 45/100]
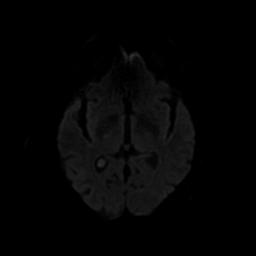
[im 56/100]
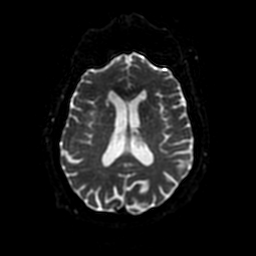
[im 67/100]
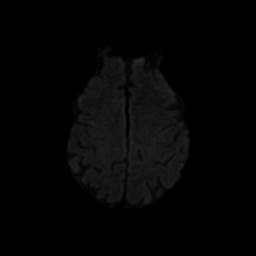
[im 89/100]
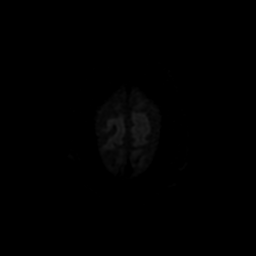
[im 100/100]
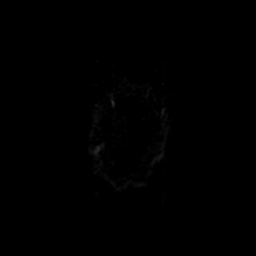

[Series 5: DWI · coronal · 4.0mm · 0.94mm/px · 7 of 70 slices shown (2 of 2)]
[im 1/70]
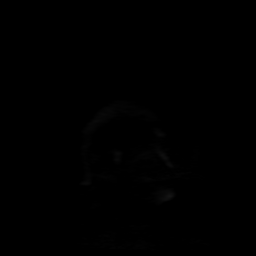
[im 12/70]
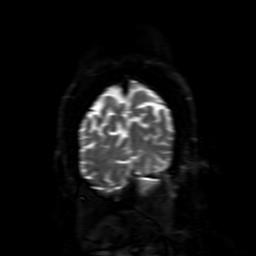
[im 24/70]
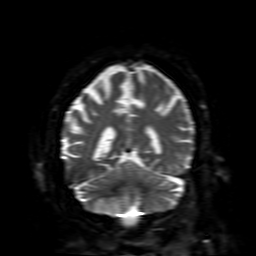
[im 35/70]
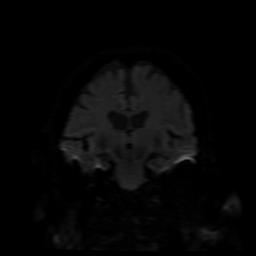
[im 47/70]
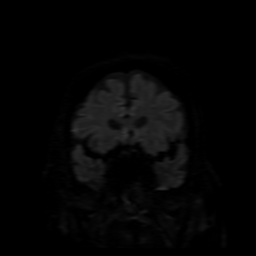
[im 58/70]
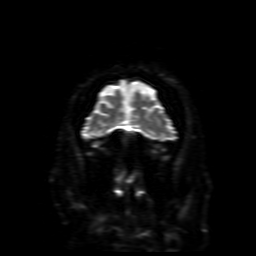
[im 70/70]
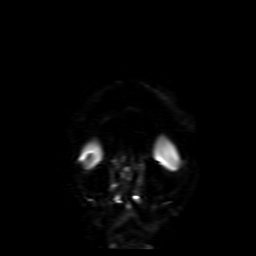

[Series 7: T2 · axial · 5.0mm · 0.43mm/px · z∈[-75,+68]mm · 3 of 25 slices shown (1 of 2)]
[im 1/25]
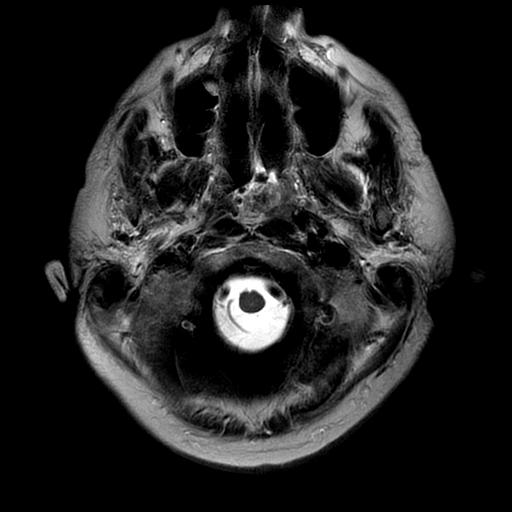
[im 13/25]
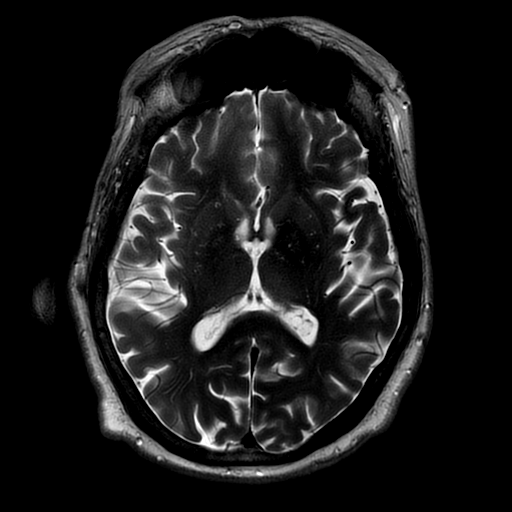
[im 25/25]
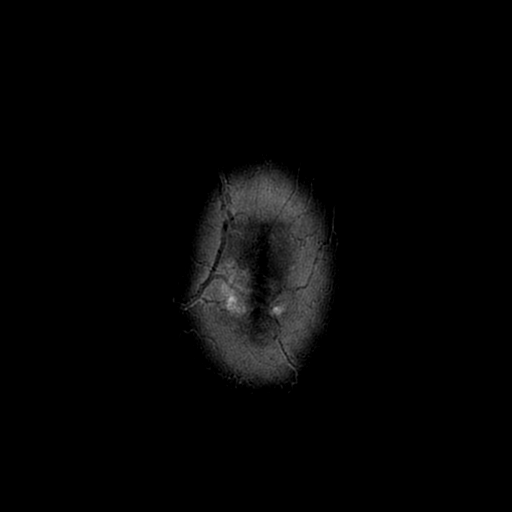

[Series 8: FLAIR · sagittal · 5.0mm · 0.47mm/px · 3 of 23 slices shown (1 of 2)]
[im 1/23]
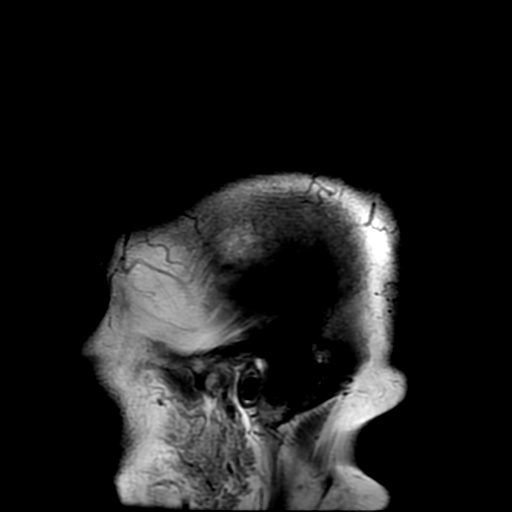
[im 12/23]
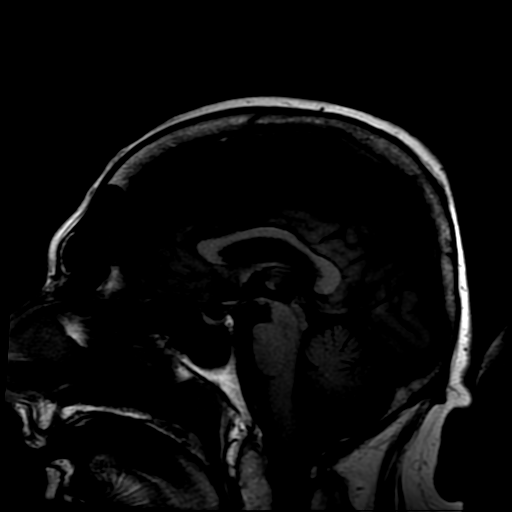
[im 23/23]
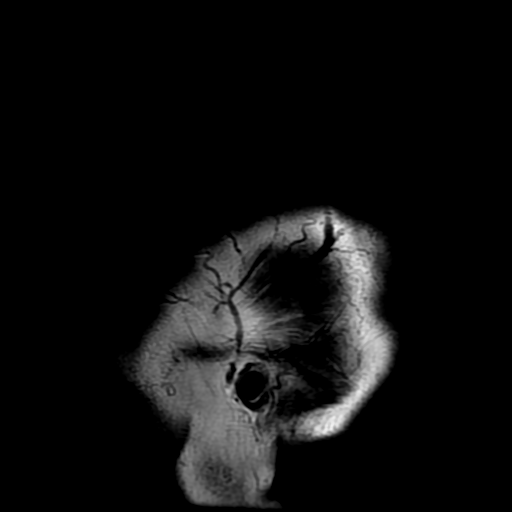

[Series 9: FLAIR · axial · 3.0mm · 0.47mm/px · z∈[-74,+70]mm · 3 of 25 slices shown (2 of 2)]
[im 1/25]
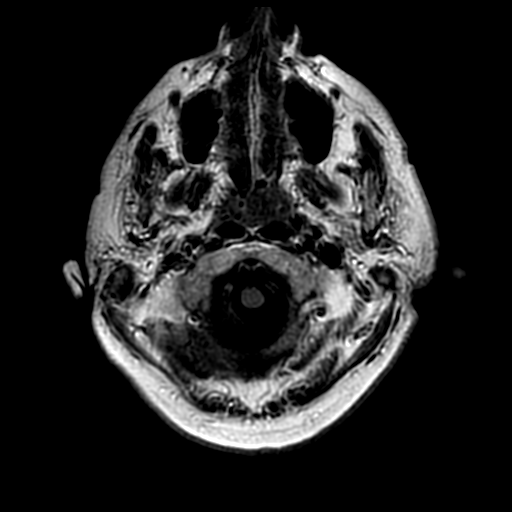
[im 13/25]
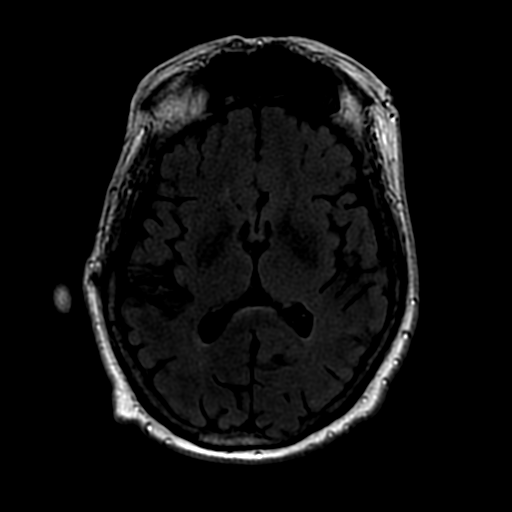
[im 25/25]
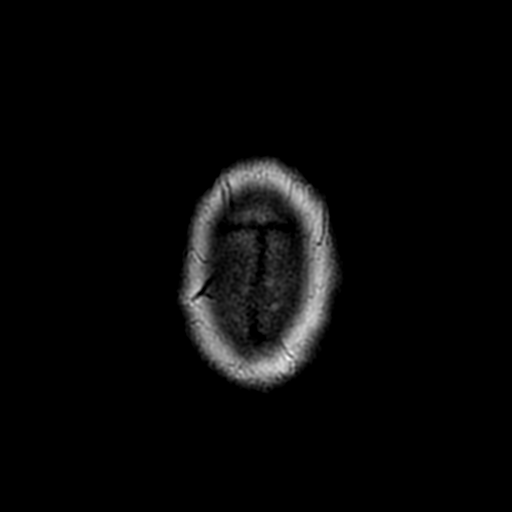

[Series 12: T2 · coronal · 5.0mm · 0.43mm/px · 2 of 29 slices shown (2 of 2)]
[im 1/29]
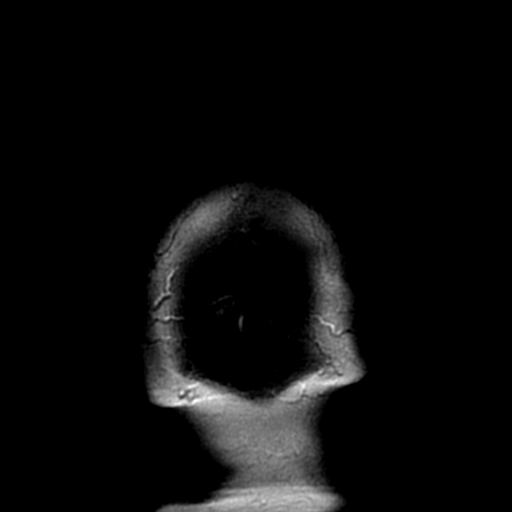
[im 15/29]
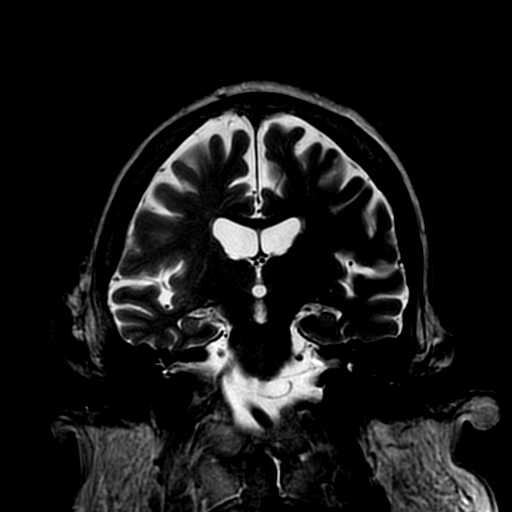

[Series 450: ADC · axial · 3.0mm · 0.94mm/px · z∈[-75,+71]mm · 6 of 50 slices shown (1 of 2)]
[im 1/50]
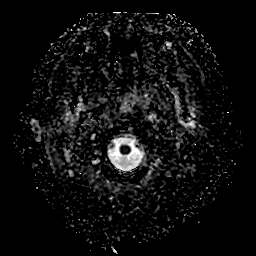
[im 10/50]
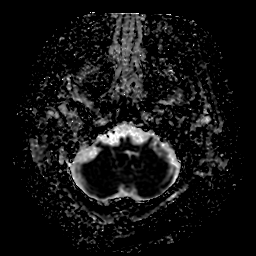
[im 20/50]
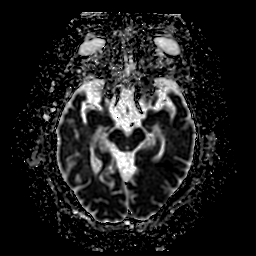
[im 30/50]
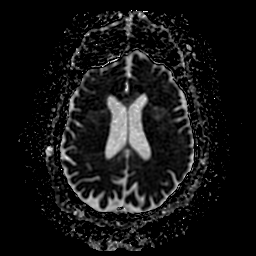
[im 40/50]
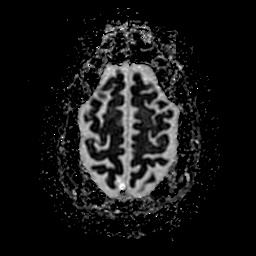
[im 50/50]
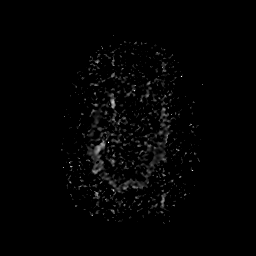

[Series 550: ADC · coronal · 4.0mm · 0.94mm/px · 4 of 35 slices shown (2 of 2)]
[im 1/35]
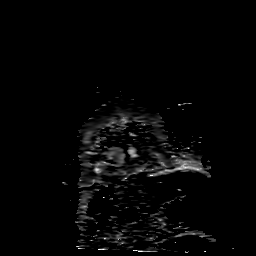
[im 12/35]
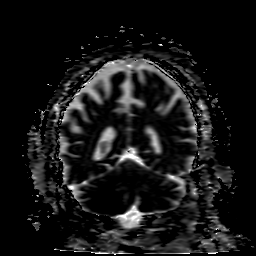
[im 23/35]
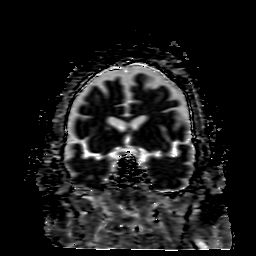
[im 35/35]
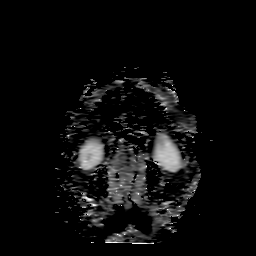

[36 of 48 positions shown; findings below may reference images not displayed]

FINDINGS: BRAIN: RIGHT inferior cerebellar reduced diffusion with low ADC
values and bright T2 signal corresponding to CT abnormality.
Regional mass effect, fourth ventricle remains patent. A few micro
hemorrhages RIGHT cerebellum. No lobar hematoma. Old small bilateral
cerebellar infarcts.Multiple tiny old bifrontal parietal cortical
infarcts. Ventricles and sulci are normal for patient's age. No
midline shift, mass effect or masses. No abnormal extra-axial fluid
collections.

VASCULAR: Normal major intracranial vascular flow voids present at
skull base.

SKULL AND UPPER CERVICAL SPINE: No abnormal sellar expansion. No
suspicious calvarial bone marrow signal. Craniocervical junction
maintained.

SINUSES/ORBITS: Trace paranasal sinus mucosal thickening. The
included ocular globes and orbital contents are non-suspicious.

OTHER: Partially imaged parotid glands appear mildly enlarged.
IMPRESSION: 1. Acute RIGHT cerebellar infarct with trace petechial hemorrhage.
Regional mass effect, no hydrocephalus.
2. Tiny old frontoparietal cortical infarcts. Old small bilateral
cerebellar infarcts.
3. Mild chronic small vessel ischemic disease.

## 2018-10-11 IMAGING — DX DG CHEST 1V PORT
1 series · 1 of 1 positions shown · non-contrast
Comparison: One-view chest x-ray 03/26/2017

CLINICAL DATA: Respiratory failure.

EXAM:
PORTABLE CHEST 1 VIEW

[chest ap]
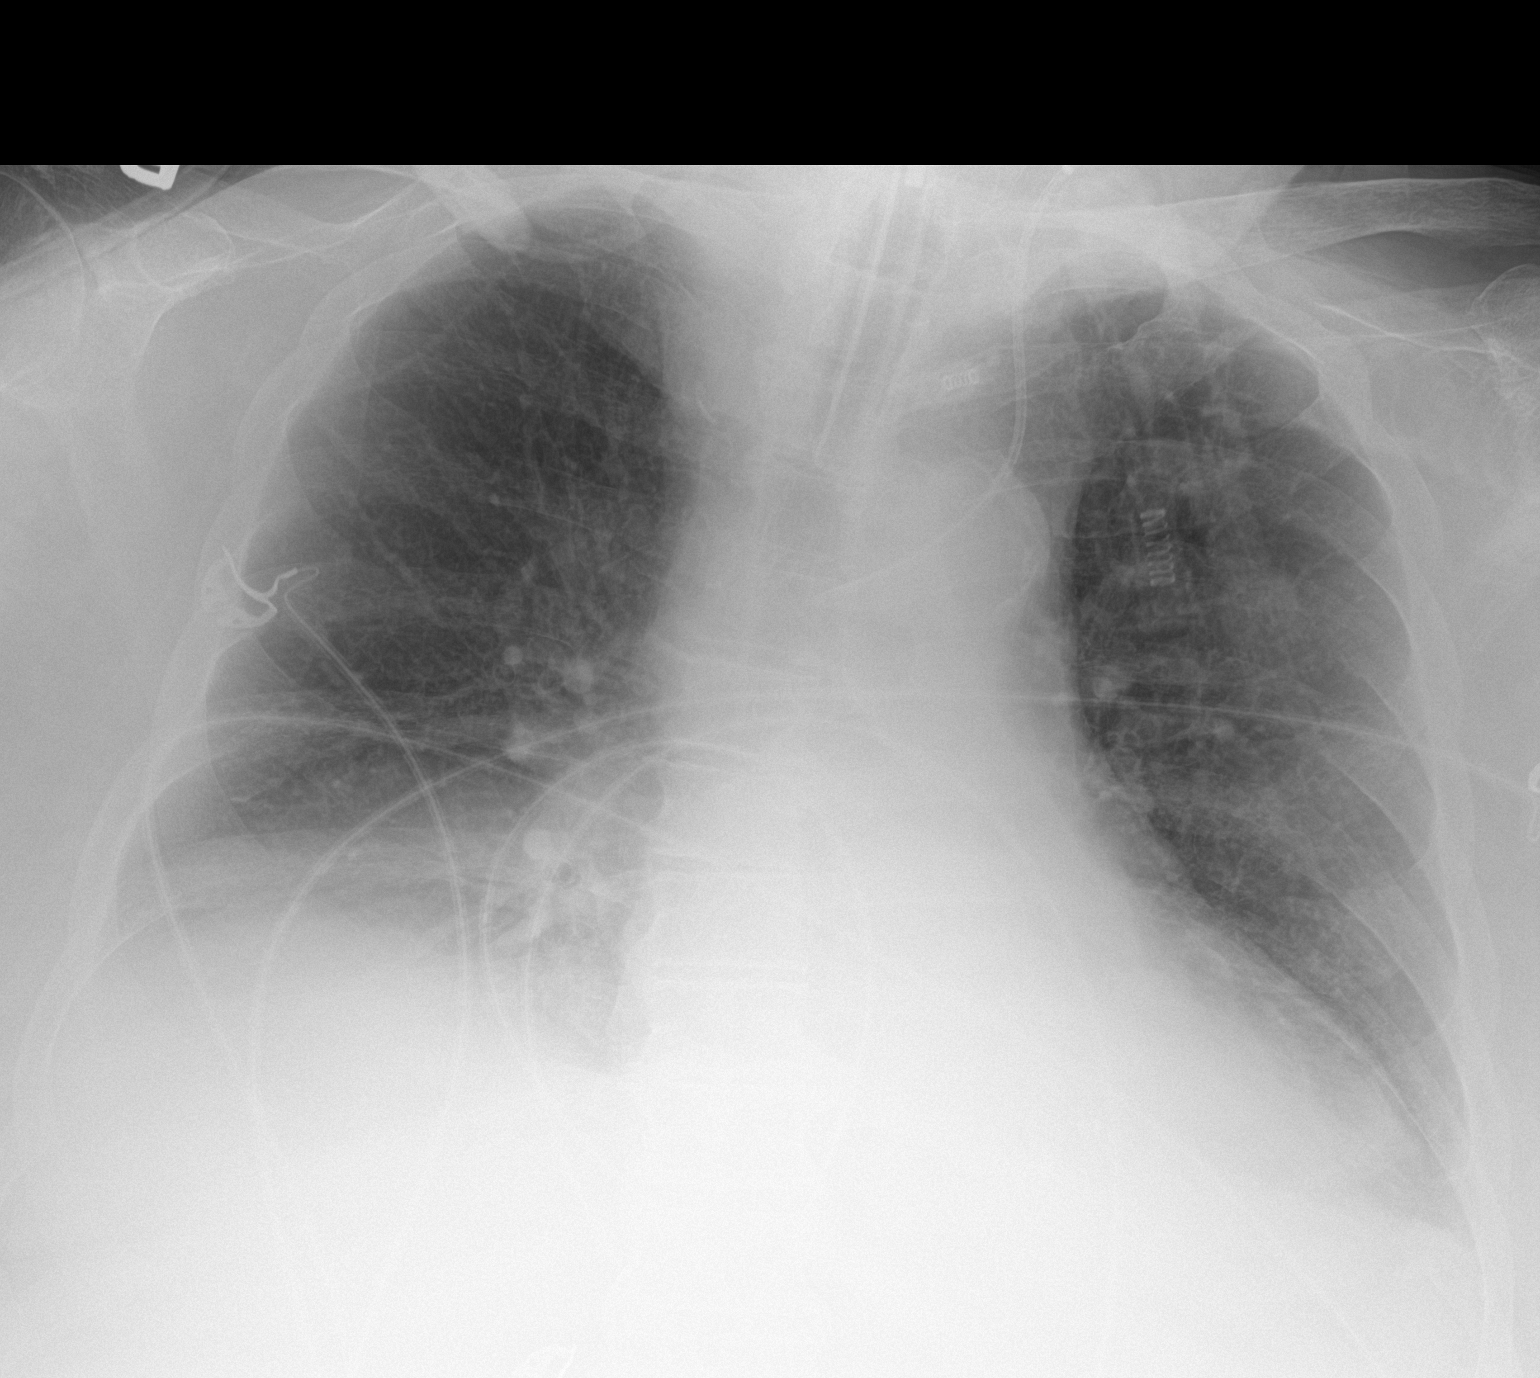

[1 of 1 positions shown; findings below may reference images not displayed]

FINDINGS: Endotracheal tube is stable, 6 cm above the carina. A left IJ line
is stable.

The heart is enlarged. Interstitial edema and bilateral effusions
are not significantly changed. Bibasilar airspace disease remains.
Aortic atherosclerosis is noted.
IMPRESSION: 1. Support apparatus is stable.
2. Stable cardiomegaly, edema, and bilateral effusions compatible
with congestive heart failure.
3. Stable to slight increase in bilateral bibasilar airspace
disease, likely atelectasis. Infection is considered less likely.
